# Patient Record
Sex: Male | Born: 1985 | Race: White | Hispanic: No | Marital: Single | Smoking: Never smoker
Health system: Southern US, Community
[De-identification: ages and names within clinical notes are randomized; demographics above are authoritative.]

## PROBLEM LIST (undated history)

## (undated) DIAGNOSIS — E119 Type 2 diabetes mellitus without complications: Secondary | ICD-10-CM

## (undated) HISTORY — PX: ESOPHAGUS SURGERY: SHX626

---

## 2004-11-21 ENCOUNTER — Emergency Department: Payer: Self-pay | Admitting: Emergency Medicine

## 2004-12-31 ENCOUNTER — Emergency Department: Payer: Self-pay | Admitting: Emergency Medicine

## 2005-03-10 ENCOUNTER — Emergency Department: Payer: Self-pay | Admitting: Emergency Medicine

## 2005-03-11 ENCOUNTER — Emergency Department: Payer: Self-pay | Admitting: Emergency Medicine

## 2005-09-18 ENCOUNTER — Inpatient Hospital Stay: Payer: Self-pay | Admitting: Internal Medicine

## 2005-10-03 ENCOUNTER — Emergency Department: Payer: Self-pay | Admitting: Emergency Medicine

## 2005-10-21 ENCOUNTER — Emergency Department: Payer: Self-pay | Admitting: Emergency Medicine

## 2006-03-06 ENCOUNTER — Emergency Department: Payer: Self-pay | Admitting: Internal Medicine

## 2007-02-04 ENCOUNTER — Emergency Department: Payer: Self-pay | Admitting: Emergency Medicine

## 2007-04-24 ENCOUNTER — Emergency Department: Payer: Self-pay | Admitting: Emergency Medicine

## 2007-10-13 ENCOUNTER — Inpatient Hospital Stay: Payer: Self-pay | Admitting: Internal Medicine

## 2007-10-13 ENCOUNTER — Other Ambulatory Visit: Payer: Self-pay

## 2007-10-28 ENCOUNTER — Emergency Department: Payer: Self-pay | Admitting: Emergency Medicine

## 2008-01-02 ENCOUNTER — Emergency Department: Payer: Self-pay | Admitting: Emergency Medicine

## 2008-01-02 ENCOUNTER — Other Ambulatory Visit: Payer: Self-pay

## 2008-01-04 ENCOUNTER — Other Ambulatory Visit: Payer: Self-pay

## 2008-01-04 ENCOUNTER — Emergency Department: Payer: Self-pay | Admitting: Emergency Medicine

## 2008-07-19 ENCOUNTER — Emergency Department: Payer: Self-pay | Admitting: Emergency Medicine

## 2009-03-16 ENCOUNTER — Emergency Department: Payer: Self-pay | Admitting: Emergency Medicine

## 2009-08-20 ENCOUNTER — Emergency Department: Payer: Self-pay | Admitting: Emergency Medicine

## 2009-10-29 ENCOUNTER — Emergency Department: Payer: Self-pay | Admitting: Emergency Medicine

## 2010-03-16 ENCOUNTER — Emergency Department: Payer: Self-pay | Admitting: Emergency Medicine

## 2010-04-05 ENCOUNTER — Emergency Department: Payer: Self-pay | Admitting: Emergency Medicine

## 2010-04-30 ENCOUNTER — Emergency Department: Payer: Self-pay | Admitting: Emergency Medicine

## 2010-08-10 ENCOUNTER — Emergency Department: Payer: Self-pay | Admitting: Emergency Medicine

## 2010-08-19 IMAGING — US US EXTREM UP VENOUS*L*
1 series · 17 of 24 positions shown · non-contrast
Comparison: none

REASON FOR EXAM: eval for blood clot
COMMENTS:

[Series 1: us extrem up venous*left* · 17 of 33 slices shown]
[im 1/33]
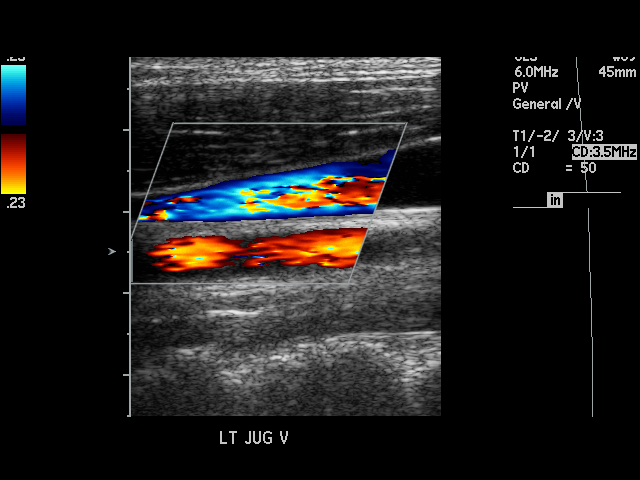
[im 3/33]
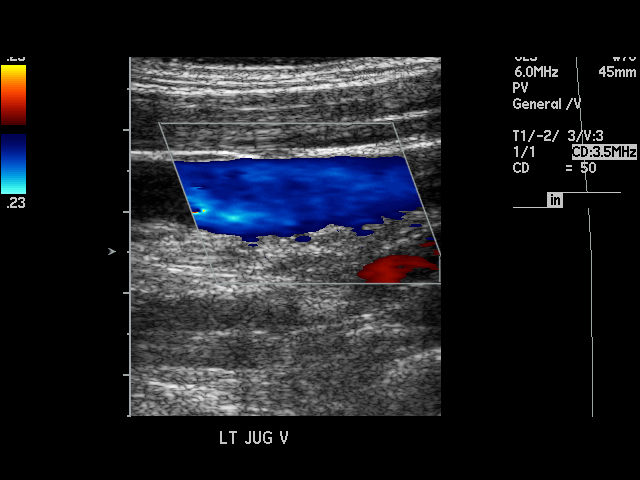
[im 5/33]
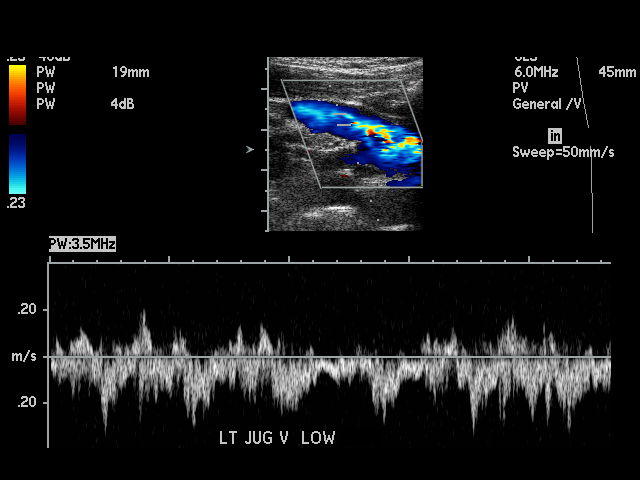
[im 6/33]
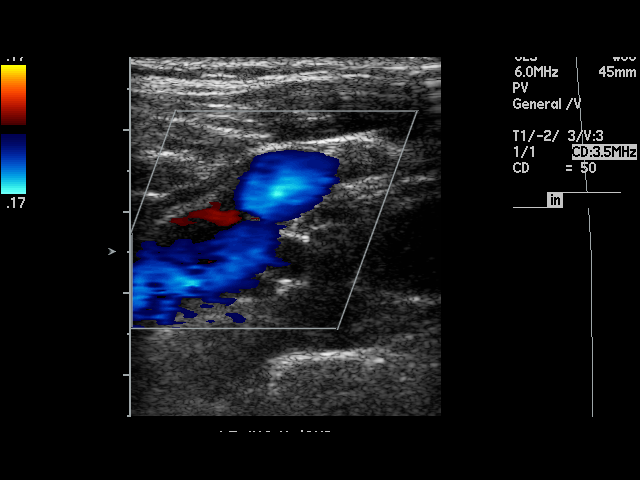
[im 9/33]
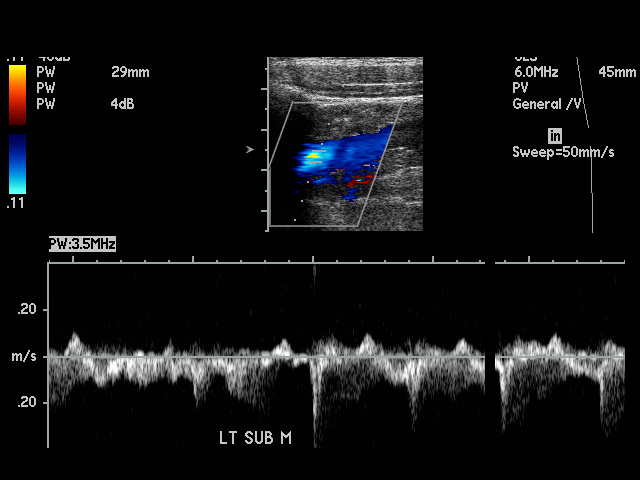
[im 10/33]
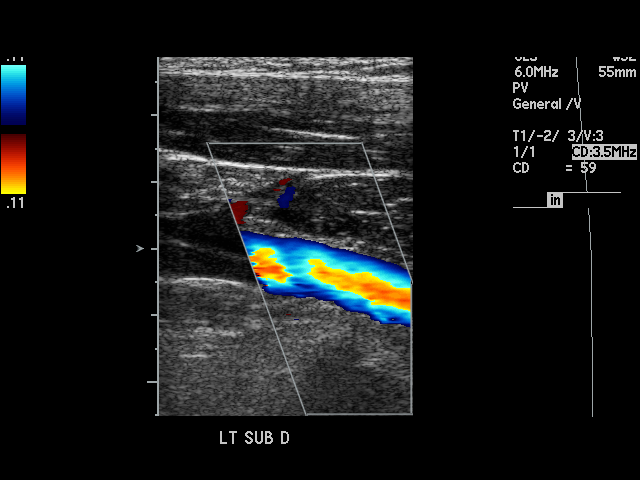
[im 13/33]
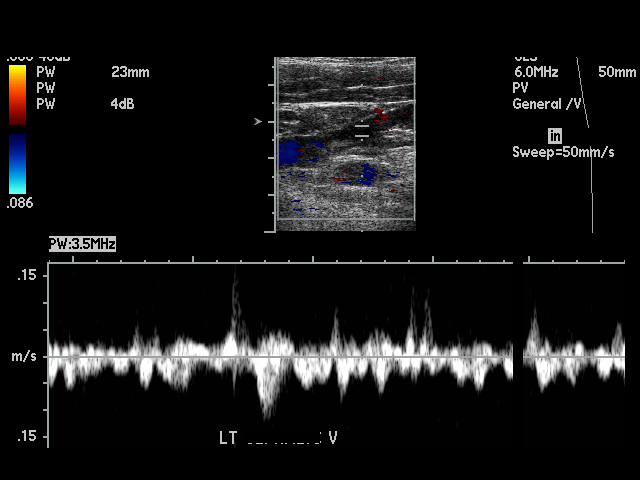
[im 14/33]
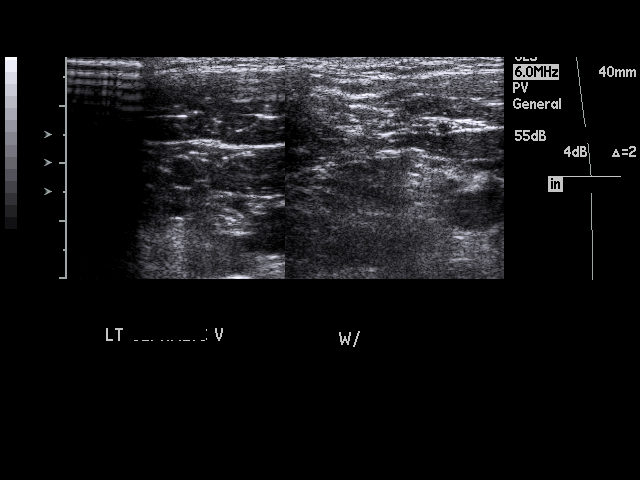
[im 17/33]
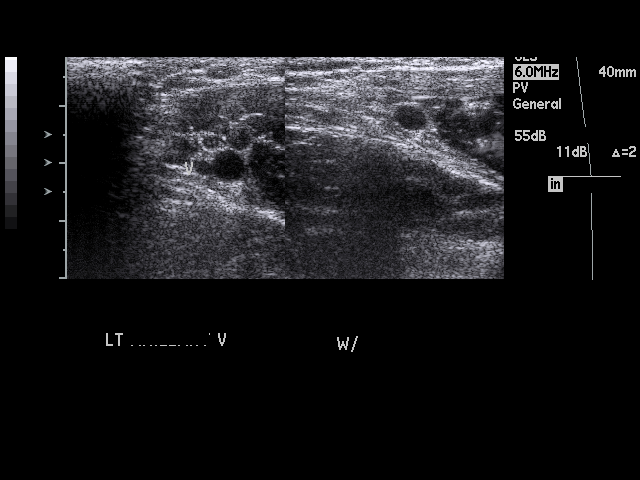
[im 19/33]
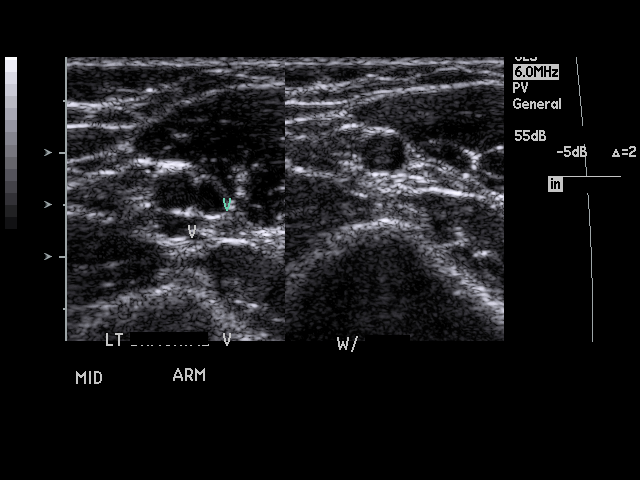
[im 20/33]
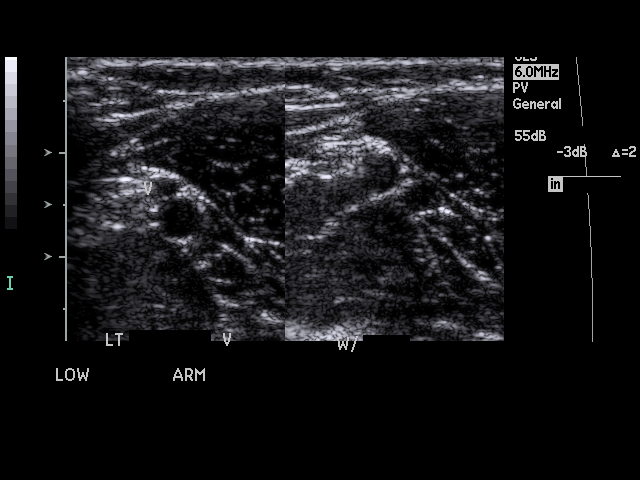
[im 23/33]
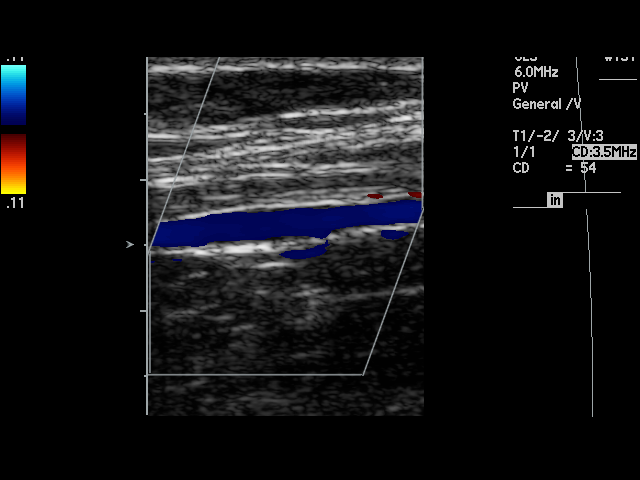
[im 24/33]
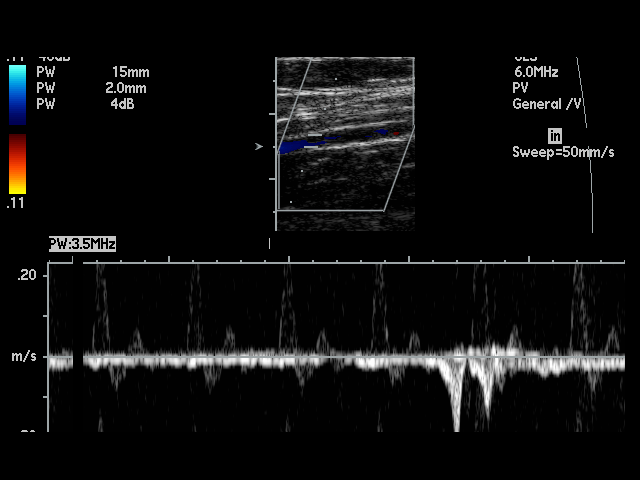
[im 27/33]
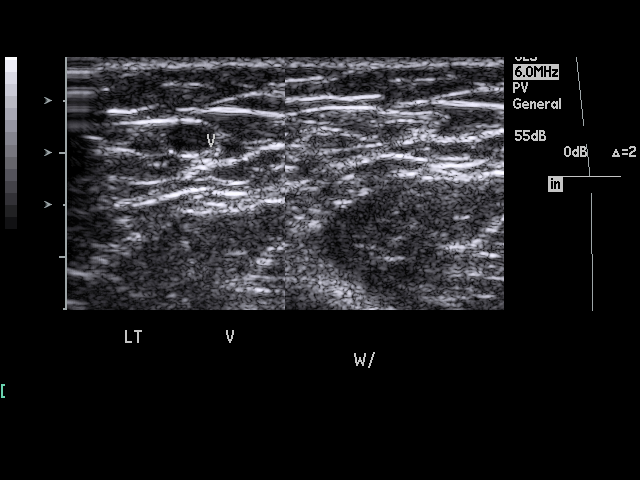
[im 28/33]
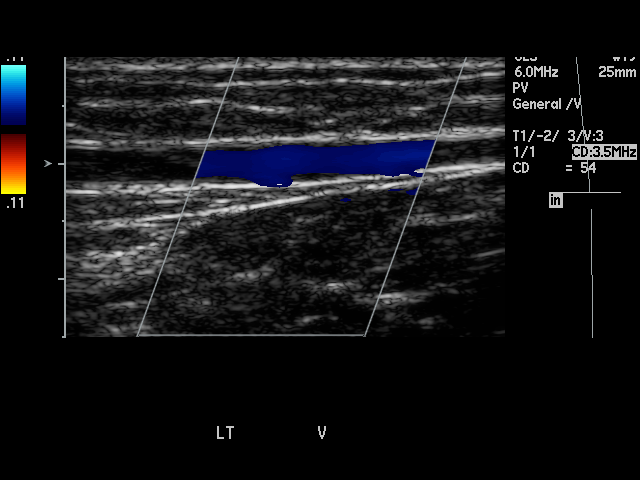
[im 30/33]
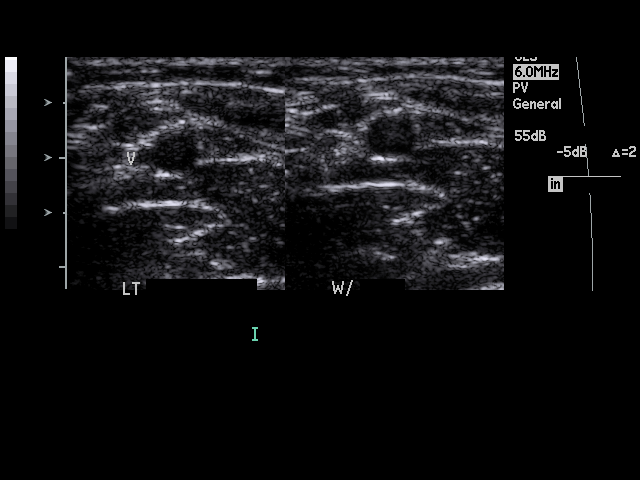
[im 33/33]
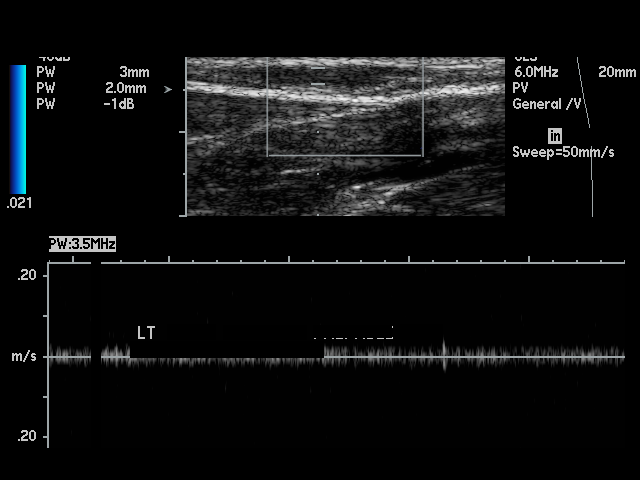

[17 of 24 positions shown; findings below may reference images not displayed]

PROCEDURE:     US  - US DOPPLER UP EXTR LEFT  - May 01, 2010  [DATE]

RESULT:     Duplex Doppler interrogation of the deep venous system of the
left upper extremity is performed from the jugular vein into the antecubital
region. The study demonstrates a noncompressible segment over a short area
in a superficial vein which may represent a branch extending to the basilic
vein in the left upper forearm. Technically, the sonographer could not
connect this venous structure to the basilic vein. The basilic is otherwise
patent. The cephalic vein is also patent. More proximally the deep venous
structures are patent and show normal compressibility with normal color and
SPECTRAL Doppler appearance.
IMPRESSION: Superficial thrombus as described. No evidence of upper
extremity DVT. Serial follow-up exams may be beneficial to document
resolution.

## 2011-02-21 ENCOUNTER — Inpatient Hospital Stay: Payer: Self-pay | Admitting: Internal Medicine

## 2011-08-10 ENCOUNTER — Emergency Department: Payer: Self-pay | Admitting: Emergency Medicine

## 2011-08-24 ENCOUNTER — Inpatient Hospital Stay: Payer: Self-pay | Admitting: Internal Medicine

## 2011-11-25 ENCOUNTER — Emergency Department: Payer: Self-pay | Admitting: *Deleted

## 2011-11-25 LAB — CBC WITH DIFFERENTIAL/PLATELET
Basophil #: 0 10*3/uL (ref 0.0–0.1)
Basophil %: 0.3 %
Eosinophil %: 4.8 %
Lymphocyte #: 3.8 10*3/uL — ABNORMAL HIGH (ref 1.0–3.6)
Lymphocyte %: 34.8 %
MCH: 30.3 pg (ref 26.0–34.0)
MCHC: 33.9 g/dL (ref 32.0–36.0)
MCV: 89 fL (ref 80–100)
Monocyte #: 0.7 10*3/uL (ref 0.0–0.7)
Monocyte %: 6.1 %
Neutrophil #: 5.9 10*3/uL (ref 1.4–6.5)
Neutrophil %: 54 %
Platelet: 246 10*3/uL (ref 150–440)
RDW: 12.8 % (ref 11.5–14.5)

## 2011-11-25 LAB — COMPREHENSIVE METABOLIC PANEL
Anion Gap: 20 — ABNORMAL HIGH (ref 7–16)
BUN: 15 mg/dL (ref 7–18)
Bilirubin,Total: 0.5 mg/dL (ref 0.2–1.0)
Chloride: 99 mmol/L (ref 98–107)
Creatinine: 1.01 mg/dL (ref 0.60–1.30)
EGFR (African American): >60
Potassium: 3.5 mmol/L (ref 3.5–5.1)
Total Protein: 8 g/dL (ref 6.4–8.2)

## 2011-11-25 LAB — ETHANOL
Ethanol %: 0.24 % — ABNORMAL HIGH (ref 0.000–0.080)
Ethanol: 240 mg/dL

## 2012-05-16 ENCOUNTER — Emergency Department: Payer: Self-pay | Admitting: Emergency Medicine

## 2012-09-19 ENCOUNTER — Inpatient Hospital Stay: Payer: Self-pay | Admitting: Internal Medicine

## 2012-09-19 LAB — BASIC METABOLIC PANEL
BUN: 24 mg/dL — ABNORMAL HIGH (ref 7–18)
Co2: 22 mmol/L (ref 21–32)
EGFR (African American): >60
Glucose: 135 mg/dL — ABNORMAL HIGH (ref 65–99)
Potassium: 3.8 mmol/L (ref 3.5–5.1)
Sodium: 138 mmol/L (ref 136–145)

## 2012-09-19 LAB — COMPREHENSIVE METABOLIC PANEL
Albumin: 3.1 g/dL — ABNORMAL LOW (ref 3.4–5.0)
Alkaline Phosphatase: 125 U/L (ref 50–136)
Alkaline Phosphatase: 103 U/L (ref 50–136)
Anion Gap: 17 — ABNORMAL HIGH (ref 7–16)
BUN: 30 mg/dL — ABNORMAL HIGH (ref 7–18)
Bilirubin,Total: 0.7 mg/dL (ref 0.2–1.0)
Calcium, Total: 8.4 mg/dL — ABNORMAL LOW (ref 8.5–10.1)
Co2: 18 mmol/L — ABNORMAL LOW (ref 21–32)
Creatinine: 1.29 mg/dL (ref 0.60–1.30)
EGFR (African American): 59 — ABNORMAL LOW
EGFR (Non-African Amer.): >60
Glucose: 446 mg/dL — ABNORMAL HIGH (ref 65–99)
Glucose: 308 mg/dL — ABNORMAL HIGH (ref 65–99)
Osmolality: 292 (ref 275–301)
Potassium: 4.6 mmol/L (ref 3.5–5.1)
Potassium: 4.4 mmol/L (ref 3.5–5.1)
SGOT(AST): 19 U/L (ref 15–37)
SGPT (ALT): 29 U/L (ref 12–78)
Sodium: 132 mmol/L — ABNORMAL LOW (ref 136–145)
Sodium: 137 mmol/L (ref 136–145)
Total Protein: 7.3 g/dL (ref 6.4–8.2)
Total Protein: 5.9 g/dL — ABNORMAL LOW (ref 6.4–8.2)

## 2012-09-19 LAB — URINALYSIS, COMPLETE
Bilirubin,UR: NEGATIVE
Glucose,UR: >=500 mg/dL (ref 0–75)
Hyaline Cast: 25
Nitrite: NEGATIVE
WBC UR: 1 /HPF (ref 0–5)

## 2012-09-19 LAB — CBC
HCT: 48.1 % (ref 40.0–52.0)
HGB: 15.5 g/dL (ref 13.0–18.0)
Platelet: 338 10*3/uL (ref 150–440)
RBC: 5.31 10*6/uL (ref 4.40–5.90)

## 2012-09-19 LAB — MAGNESIUM: Magnesium: 1.9 mg/dL

## 2012-09-20 LAB — CBC WITH DIFFERENTIAL/PLATELET
Basophil %: 0.3 %
HCT: 34.5 % — ABNORMAL LOW (ref 40.0–52.0)
MCH: 30.3 pg (ref 26.0–34.0)
MCHC: 33.4 g/dL (ref 32.0–36.0)
MCV: 91 fL (ref 80–100)
Monocyte %: 7.6 %
Neutrophil #: 6.8 10*3/uL — ABNORMAL HIGH (ref 1.4–6.5)
RDW: 13.7 % (ref 11.5–14.5)
WBC: 10.8 10*3/uL — ABNORMAL HIGH (ref 3.8–10.6)

## 2012-09-20 LAB — BASIC METABOLIC PANEL
Calcium, Total: 7.7 mg/dL — ABNORMAL LOW (ref 8.5–10.1)
Chloride: 112 mmol/L — ABNORMAL HIGH (ref 98–107)
Co2: 24 mmol/L (ref 21–32)
EGFR (Non-African Amer.): >60
Glucose: 117 mg/dL — ABNORMAL HIGH (ref 65–99)
Potassium: 3.5 mmol/L (ref 3.5–5.1)
Sodium: 144 mmol/L (ref 136–145)

## 2012-11-24 ENCOUNTER — Emergency Department: Payer: Self-pay | Admitting: Emergency Medicine

## 2012-11-24 LAB — COMPREHENSIVE METABOLIC PANEL
Albumin: 4 g/dL (ref 3.4–5.0)
Alkaline Phosphatase: 88 U/L (ref 50–136)
Anion Gap: 8 (ref 7–16)
Bilirubin,Total: 1.1 mg/dL — ABNORMAL HIGH (ref 0.2–1.0)
Chloride: 105 mmol/L (ref 98–107)
Co2: 20 mmol/L — ABNORMAL LOW (ref 21–32)
Creatinine: 1.09 mg/dL (ref 0.60–1.30)
EGFR (African American): >60
Potassium: 4.7 mmol/L (ref 3.5–5.1)
SGPT (ALT): 22 U/L (ref 12–78)
Total Protein: 7.4 g/dL (ref 6.4–8.2)

## 2012-11-24 LAB — CBC WITH DIFFERENTIAL/PLATELET
Basophil #: 0 10*3/uL (ref 0.0–0.1)
Basophil %: 0.3 %
Eosinophil #: 0.6 10*3/uL (ref 0.0–0.7)
HCT: 49.1 % (ref 40.0–52.0)
HGB: 16.4 g/dL (ref 13.0–18.0)
Lymphocyte #: 1.1 10*3/uL (ref 1.0–3.6)
MCHC: 33.4 g/dL (ref 32.0–36.0)
MCV: 89 fL (ref 80–100)
Monocyte #: 0.5 x10 3/mm (ref 0.2–1.0)
Monocyte %: 3.3 %
Platelet: 259 10*3/uL (ref 150–440)
RBC: 5.53 10*6/uL (ref 4.40–5.90)
RDW: 12.4 % (ref 11.5–14.5)
WBC: 13.8 10*3/uL — ABNORMAL HIGH (ref 3.8–10.6)

## 2012-11-24 LAB — URINALYSIS, COMPLETE
Bilirubin,UR: NEGATIVE
Blood: NEGATIVE
Glucose,UR: >=500 mg/dL (ref 0–75)
Nitrite: NEGATIVE
Ph: 5 (ref 4.5–8.0)
Protein: NEGATIVE
Specific Gravity: 1.032 (ref 1.003–1.030)
Squamous Epithelial: NONE SEEN

## 2012-11-24 LAB — LIPASE, BLOOD: Lipase: 53 U/L — ABNORMAL LOW (ref 73–393)

## 2013-02-15 ENCOUNTER — Inpatient Hospital Stay: Payer: Self-pay | Admitting: Specialist

## 2013-02-15 LAB — COMPREHENSIVE METABOLIC PANEL
Albumin: 4.4 g/dL (ref 3.4–5.0)
Alkaline Phosphatase: 152 U/L — ABNORMAL HIGH (ref 50–136)
Anion Gap: 16 (ref 7–16)
BUN: 21 mg/dL — ABNORMAL HIGH (ref 7–18)
Bilirubin,Total: 1.1 mg/dL — ABNORMAL HIGH (ref 0.2–1.0)
Calcium, Total: 9.5 mg/dL (ref 8.5–10.1)
Chloride: 99 mmol/L (ref 98–107)
Co2: 16 mmol/L — ABNORMAL LOW (ref 21–32)
Creatinine: 1.12 mg/dL (ref 0.60–1.30)
EGFR (African American): >60
EGFR (Non-African Amer.): >60
Glucose: 577 mg/dL (ref 65–99)
Potassium: 5.9 mmol/L — ABNORMAL HIGH (ref 3.5–5.1)
SGPT (ALT): 21 U/L (ref 12–78)
Total Protein: 8.3 g/dL — ABNORMAL HIGH (ref 6.4–8.2)

## 2013-02-15 LAB — URINALYSIS, COMPLETE
Bilirubin,UR: NEGATIVE
Blood: NEGATIVE
Glucose,UR: >=500 mg/dL (ref 0–75)
Protein: NEGATIVE
Specific Gravity: 1.028 (ref 1.003–1.030)
Squamous Epithelial: NONE SEEN
WBC UR: <1 /HPF (ref 0–5)

## 2013-02-15 LAB — CBC
HCT: 49.5 % (ref 40.0–52.0)
HGB: 16.6 g/dL (ref 13.0–18.0)
MCH: 29.9 pg (ref 26.0–34.0)
MCHC: 33.5 g/dL (ref 32.0–36.0)
RDW: 13.6 % (ref 11.5–14.5)

## 2013-02-15 LAB — BASIC METABOLIC PANEL
Anion Gap: 14 (ref 7–16)
BUN: 22 mg/dL — ABNORMAL HIGH (ref 7–18)
Calcium, Total: 8.5 mg/dL (ref 8.5–10.1)
Calcium, Total: 9.1 mg/dL (ref 8.5–10.1)
Chloride: 109 mmol/L — ABNORMAL HIGH (ref 98–107)
Chloride: 116 mmol/L — ABNORMAL HIGH (ref 98–107)
Creatinine: 1.21 mg/dL (ref 0.60–1.30)
Creatinine: 1.16 mg/dL (ref 0.60–1.30)
EGFR (African American): >60
EGFR (Non-African Amer.): >60
Glucose: 159 mg/dL — ABNORMAL HIGH (ref 65–99)
Osmolality: 294 (ref 275–301)
Sodium: 145 mmol/L (ref 136–145)

## 2013-02-15 LAB — LIPASE, BLOOD: Lipase: 40 U/L — ABNORMAL LOW (ref 73–393)

## 2013-02-16 LAB — BASIC METABOLIC PANEL
BUN: 15 mg/dL (ref 7–18)
Chloride: 112 mmol/L — ABNORMAL HIGH (ref 98–107)
Co2: 25 mmol/L (ref 21–32)
Creatinine: 1.08 mg/dL (ref 0.60–1.30)
EGFR (Non-African Amer.): >60
Potassium: 3.9 mmol/L (ref 3.5–5.1)
Sodium: 143 mmol/L (ref 136–145)

## 2013-02-16 LAB — CBC WITH DIFFERENTIAL/PLATELET
Basophil #: 0 10*3/uL (ref 0.0–0.1)
HCT: 37.9 % — ABNORMAL LOW (ref 40.0–52.0)
Monocyte #: 1.2 x10 3/mm — ABNORMAL HIGH (ref 0.2–1.0)
Neutrophil #: 12.8 10*3/uL — ABNORMAL HIGH (ref 1.4–6.5)
Neutrophil %: 73.4 %
RBC: 4.39 10*6/uL — ABNORMAL LOW (ref 4.40–5.90)
RDW: 13.6 % (ref 11.5–14.5)

## 2013-02-16 LAB — HEMOGLOBIN A1C: Hemoglobin A1C: 10.8 % — ABNORMAL HIGH (ref 4.2–6.3)

## 2013-02-17 ENCOUNTER — Emergency Department: Payer: Self-pay | Admitting: Emergency Medicine

## 2013-02-17 LAB — BASIC METABOLIC PANEL
Anion Gap: 6 — ABNORMAL LOW (ref 7–16)
BUN: 12 mg/dL (ref 7–18)
Calcium, Total: 9 mg/dL (ref 8.5–10.1)
Chloride: 102 mmol/L (ref 98–107)
Co2: 31 mmol/L (ref 21–32)
Creatinine: 0.98 mg/dL (ref 0.60–1.30)
EGFR (African American): >60
Glucose: 402 mg/dL — ABNORMAL HIGH (ref 65–99)
Potassium: 4.3 mmol/L (ref 3.5–5.1)
Sodium: 139 mmol/L (ref 136–145)

## 2013-07-11 ENCOUNTER — Emergency Department: Payer: Self-pay | Admitting: Emergency Medicine

## 2013-07-11 LAB — URINALYSIS, COMPLETE
Blood: NEGATIVE
Leukocyte Esterase: NEGATIVE
RBC,UR: 1 /HPF (ref 0–5)
WBC UR: NONE SEEN /HPF (ref 0–5)

## 2013-07-11 LAB — COMPREHENSIVE METABOLIC PANEL
Alkaline Phosphatase: 194 U/L — ABNORMAL HIGH (ref 50–136)
Anion Gap: 9 (ref 7–16)
BUN: 21 mg/dL — ABNORMAL HIGH (ref 7–18)
Bilirubin,Total: 0.6 mg/dL (ref 0.2–1.0)
Calcium, Total: 9.5 mg/dL (ref 8.5–10.1)
Chloride: 93 mmol/L — ABNORMAL LOW (ref 98–107)
Co2: 25 mmol/L (ref 21–32)
EGFR (African American): >60
EGFR (Non-African Amer.): >60
Glucose: 581 mg/dL (ref 65–99)
Osmolality: 285 (ref 275–301)
Potassium: 4.4 mmol/L (ref 3.5–5.1)
SGPT (ALT): 23 U/L (ref 12–78)
Sodium: 127 mmol/L — ABNORMAL LOW (ref 136–145)
Total Protein: 8.2 g/dL (ref 6.4–8.2)

## 2013-07-11 LAB — CBC
HCT: 46.1 % (ref 40.0–52.0)
MCH: 30.7 pg (ref 26.0–34.0)
MCHC: 34.5 g/dL (ref 32.0–36.0)
WBC: 9.4 10*3/uL (ref 3.8–10.6)

## 2014-01-27 ENCOUNTER — Emergency Department: Payer: Self-pay | Admitting: Emergency Medicine

## 2014-01-28 LAB — BETA STREP CULTURE(ARMC)

## 2014-10-22 ENCOUNTER — Ambulatory Visit: Payer: Self-pay

## 2015-01-11 NOTE — H&P (Signed)
PATIENT NAME:  Jeff Gutierrez, Jeff Gutierrez MR#:  [id number] DATE OF BIRTH:  1985-10-21  DATE OF ADMISSION:  09/19/2012  REFERRING PHYSICIAN: Janalyn Harderavid Kaminski, MD  PR[facility]IC[facility], [facility]  CHIEF COMPLAINT: Nausea and vomiting.   HISTORY OF PRESENT ILLNESS: This is a 29 year old male with history of type 1 diabetes mellitus on home NovoLog and Humulin N who presents with complaints of nausea and vomiting. The patient reports he has been out of his glucose monitoring strips for one week. As well, he reports he has been trying to save on his Humulin insulin and has not been taking it for a week. The patient reports he is on Humulin N 50 units at bedtime and as well on NovoLog sliding scale plus 10 units before each meal. Reportedly he has been trying to save on his Humulin N so he has not been taking it for the last week. As well he has been only taking 10 units before meals of NovoLog without sliding scale coverage as he is out of the testing strips. The patient reports for the last two days he has been complaining of nausea and vomiting, which prompted him to come to the ED.  In the ED, the patient was found to be in acute renal failure with a creatinine of 1.7, as well in DKA with pH of 7.29, and with CO2 of 216 and anion gap of 17. The patient received 2 liters of fluid and started on insulin drip. The patient reports he started feeling improvement in his symptoms. The patient denies any shortness of breath, any chest pain, any coffee-ground emesis, any diarrhea or dysuria. Hospitalist services were requested to admit the patient for further management and treatment of his DKA.   PAST MEDICAL HISTORY:  1. Diabetes type 1, insulin-dependent. 2. Hypoglycemic seizures.   HOME MEDICATIONS: 1. Humulin N 50 units at bedtime, but the patient has not been taking it for the last week.  2. NovoLog sliding scale 10 units plus sliding scale before meals. The patient only has been  taking 10 units before meals without insulin sliding scale coverage for the last few days.   ALLERGIES: No known drug allergies.   SOCIAL HISTORY: No smoking. No alcohol. No IV drug use. Working in [employer].   FAMILY HISTORY: Significant for diabetes in his grandparents.   REVIEW OF SYSTEMS:   CONSTITUTIONAL: No fevers. No chills but complains of fatigue and weakness.   EYES: Blurry vision, double vision, inflammation, glaucoma or pain.   ENT: Denies tinnitus, ear pain, hearing loss, epistaxis or discharge.   RESPIRATORY: Denies cough, wheezing, hemoptysis, dyspnea or COPD.  CARDIOVASCULAR: Denies any chest pain, orthopnea, edema, arrhythmia, palpitations or syncope.   GASTROINTESTINAL: Complains of nausea and vomiting. Denies any diarrhea, constipation, hematemesis, melena, jaundice or rectal bleed.   GENITOURINARY: Denies dysuria, hematuria or renal colic.  ENDOCRINE: Denies polyuria, polydipsia or heat or cold intolerance.   HEMATOLOGY: Denies anemia, easy bruising or bleeding diathesis.   INTEGUMENTARY: Denies acne, rash or skin lesion.   MUSCULOSKELETAL: Denies any gout, redness, limited activity, arthritis or cramps.   NEUROLOGICAL: Denies numbness, dysarthria, epilepsy, tremors or vertigo.   PSYCHIATRIC: Denies anxiety, insomnia, schizophrenia, nervousness or bipolar disorder.   PHYSICAL EXAMINATION:  VITAL SIGNS: Temperature 96, pulse 103, respiratory 22, blood pressure 143/79 and saturating 100% on room air.   GENERAL: Well-nourished male who looks comfortable and in no apparent distress.   HEENT: Head atraumatic, normocephalic . Pupils equal  and reactive to light. Pink conjunctivae. Anicteric sclerae. Dry oral mucosa.   NECK: Supple. No thyromegaly. No JVD.   CHEST: Good air entry bilaterally. No wheezing, rales or rhonchi.   CARDIOVASCULAR: S1 and S2 heard. No rubs, murmurs or gallops. Tachycardic.   ABDOMEN: Soft, nontender and  nondistended. Bowel sounds present.   EXTREMITIES: No edema, no clubbing and no cyanosis.   PSYCHIATRIC: Appropriate affect. Awake and alert x 3. Intact judgment and insight.   NEUROLOGIC: Cranial nerves II through XII grossly intact. Motor 5 out of 5.   SKIN: Warm and dry. Delayed skin turgor.   PERTINENT LABS: Glucose 446, BUN 38, creatinine 1.78, sodium 132, potassium 4.6, chloride 99 and CO2 16. Anion gap 17. Osmolality 293. Lipase 36. White blood cells 18.8, hemoglobin 15.5, hematocrit 48.1 and platelet 338.   Urinalysis is showing a glucose of more than 500, negative leukocyte esterase and nitrites.   ABG is showing pH of 7.29.  ASSESSMENT AND PLAN: A 29 year old male with type 1 diabetes who presents with nausea, vomiting and DKA due to noncompliance with medications secondary to financial issues.  1. DKA. The patient has not been taking his Humulin N for one week and taking lower dose NovoLog, complaining of nausea and vomiting, dehydrated. The patient will be admitted to the CCU and will be started on an insulin drip, as per protocol. We will continue with IV fluids. He already received 2 liters of IV normal saline in the ED. We will give him another 2 liters. We will keep him on 150 mL/h. We will monitor his electrolytes closely and replace as needed. We will have him on clear liquid diet and on nausea medication. Once able to tolerate that, we will advance his diet as tolerated. We will consult social worker and case management for possible need for assistance with his insulin at home.  2. Acute renal failure. This is secondary to dehydration from volume depletion, nausea and vomiting and DKA. We will continue with IV normal saline and we will monitor electrolytes and renal function closely.  3. Leukocytosis. This is stress induced from his DKA and nausea and vomiting. He has negative urinalysis, afebrile. We will recheck CBC in a.m.  4. CODE STATUS: The patient is FULL CODE. 5. DVT  with subcutaneous heparin.   TOTAL TIME SPENT ON ADMISSION AND PATIENT CARE: 55 minutes.  ____________________________ Starleen Arms, MD dse:sb Gutierrez: 09/19/2012 03:12:36 ET T: 09/19/2012 07:15:18 ET JOB#: [id number]  cc: Starleen Arms, MD, <Dictator> DAWOOD Teena Irani MD ELECTRONICALLY SIGNED 09/20/2012 0:35

## 2015-01-14 NOTE — Discharge Summary (Signed)
PATIENT NAME:  Jeff NestleLOY, Cash D MR#:  [id number] DATE OF BIRTH:  1985-12-17  DATE OF ADMISSION:  02/15/2013 DATE OF DISCHARGE:  02/16/2013  HISTORY AND PHYSICAL:  For a detailed note, please take a look at the history and physical done on admission.   DISCHARGE DIAGNOSES: As follows:  1.  Acute diabetic ketoacidosis.  2.  Hyponatremia.  3.  Hyperkalemia.  4.  Leukocytosis.   DIET: The patient is being discharged a carb-controlled diet.   ACTIVITY: As tolerated.   FOLLOWUP: With [facility].  DISCHARGE MEDICATIONS:  Humulin NPH 30 units at bedtime and Humalog 5 units t.i.d. with meals.   BRIEF HOSPITAL COURSE: This is a 29 year old male with medical problems as mentioned above, presented to the hospital with persistent nausea, vomiting and noted to have significantly elevated blood sugars and noted to be in acute diabetic ketoacidosis.   1.  Acute diabetic ketoacidosis:  This was likely secondary to the patient's recent acute viral infection that he has been suffering from, complicated with possible underlying noncompliance. The patient's hemoglobin A1c was noted to be as high as 10. The patient was admitted to the intensive care unit, started on aggressive IV fluids and also an insulin drip. Serial metabolic profiles were obtained. The patient was eventually taken off the insulin drip. As his anion gap closed, his nausea and vomiting has since then resolved. He has been tolerating p.o. well on a carb-controlled diet.  He is currently being discharged back on his NPH and Humulin with meals. He was advised to have a close followup with his [facility]in[facility] so his insulin therapy can be further adjusted.   2.  Leukocytosis:  I suspect this was likely secondary to stress-mediated from diabetic ketoacidosis. It did trend down. The patient had no fever, and he remained hemodynamically stable. This can further be followed as an outpatient.   3.  Acute renal failure: This was likely secondary to dehydration and acute diabetic ketoacidosis. This has since then improved and resolved with IV fluid hydration.  4.  Hyperkalemia:  The patient presented with a potassium of 5.9. This was likely secondary to acidosis and from the renal failure. His potassium has now normalized as his acidosis has been corrected from the insulin drip and IV fluids.  5.  Hyponatremia:  This was pseudohyponatremia from severe hyperglycemia.  It has since then corrected itself and has significantly improved as the sugars have improved.   CODE STATUS: The patient is a FULL CODE.   DISPOSITION: He is being discharged home.    TIME SPENT:  40 minutes.   ____________________________ Rolly PancakeVivek J. Cherlynn KaiserSainani, MD vjs:cb D: 02/16/2013 15:12:57 ET T: 02/16/2013 15:37:58 ET JOB#: [id number]  cc: Rolly PancakeVivek J. Cherlynn KaiserSainani, MD, <Dictator> Houston SirenVIVEK J SAINANI MD ELECTRONICALLY SIGNED 03/01/2013 20:28

## 2015-01-14 NOTE — Discharge Summary (Signed)
PATIENT NAME:  Jeff Gutierrez, Jeff Gutierrez MR#:  [id number] DATE OF BIRTH:  Oct 23, 1985  DATE OF ADMISSION:  09/19/2012 DATE OF DISCHARGE:  09/20/2012  FINAL DIAGNOSES: 1.  Diabetic ketoacidosis, resolved.  2.  Acute renal failure, improved.  3.  Leukocytosis.  4.  Hypokalemia, hypomagnesemia.   MEDICATIONS ON DISCHARGE:  Include insulin isophane 100 units/mL, human recombinant subcutaneous suspension 50 units subcutaneous twice a day, insulin lispro 10 units subcutaneous injection 3 times a day prior to meals, potassium chloride 20 mEq daily for 3 days, magnesium oxide 400 mg daily for 5 days.   DIET: Carbohydrate-controlled diet, regular consistency.   ACTIVITY:  As tolerated.   FO[facility]UP[facility]  [facility]   REASON FOR ADMISSION: The patient was admitted 12/27 with nausea, vomiting.   HISTORY OF PRESENT ILLNESS: A 29 year old man with type 1 diabetes who presented with nausea, vomiting. He been out of his glucose monitoring strip for 1 week. He was trying to save on his Humulin insulin and had not been taking it. He was found to be in diabetic ketoacidosis with a pH of 7.29, anion gap of 17. He was admitted and started on insulin drip and IV fluid hydration was given.   LABORATORY AND RADIOLOGICAL DATA DURING THE HOSPITAL COURSE:  Included a lipase of 36, glucose 446, BUN 38, creatinine 1.78, sodium 132, potassium 4.6, chloride 99, CO2 16, calcium 9.4. Liver function tests normal range. White blood cell count 18.8, H and H 15.5 and 48.1, platelet count of 338. Urinalysis 1+ ketones, greater than 500 mg/dL of glucose. Venous pH of 7.29. Chemistry upon discharge included a glucose of 117, BUN 14, creatinine 0.93, sodium 144, potassium 3.5, chloride 112, CO2 24. White blood cell count upon discharge 10.8. Last finger stick upon discharge 127.  HOSPITAL COURSE PER PROBLEM LIST:  1.  DKA which had resolved. The patient was initially started on an insulin drip in the ICU, converted back over  to his usual medications of 50 units subcutaneous twice a day of Humulin-N. He was put back on his short-acting lispro. He does have his medication at home, the Humulin-N, and I did give him a script for the lispro insulin, Flex pen. Noncompliance here was the issue.  2.  Acute renal failure. This had improved with IV fluid hydration.  No more nausea or vomiting. He was tolerating diet.  3.  Leukocytosis, probably from the DKA and nausea and vomiting. This had improved.  4.  Hypokalemia and hypomagnesemia. This was probably from the insulin drip. I will replace for a few days upon discharge.   TIME SPENT ON DISCHARGE: 35 minutes.     ____________________________ Herschell Dimesichard J. Renae GlossWieting, MD rjw:cs Gutierrez: 09/20/2012 14:45:33 ET T: 09/21/2012 18:22:55 ET JOB#: [id number]  cc: Herschell Dimesichard J. Renae GlossWieting, MD, <Dictator> [facility] Jeff Mcfarlane J Tahji Duvall MD ELECTRONICALLY SIGNED 09/25/2012 19:07

## 2015-01-14 NOTE — H&P (Signed)
PATIENT NAME:  Jeff Gutierrez, Jeff Gutierrez MR#:  [id number] DATE OF BIRTH:  Jan 12, 1986  DATE OF ADMISSION:  02/15/2013  PRIMARY CARE PHYSICIAN: [facility] at [facility].   CHIEF COMPLAINT: Nausea, vomiting and elevated blood sugars.   HISTORY OF PRESENT ILLNESS: This is a 29 year old male who presents to the hospital with significantly elevated blood sugars and having persistent nausea, vomiting since late last Sunday evening. The patient presented to the ER and was noted to be severely hyperglycemic and noted to be in acute diabetic ketoacidosis.   Hospitalist services were contacted for further treatment and evaluation. The patient does say that he had an upper respiratory infection about 2 weeks ago, and he is still dealing with the cycle of the upper respiratory infection. He still has a cough but no fevers, no sore throat. No other associated symptoms presently.   REVIEW OF SYSTEMS:  CONSTITUTIONAL: No documented fever. No weight gain. No weight loss.  EYES: No blurry or doubled vision.  ENT: No tinnitus. No postnasal drip. No redness of the oropharynx.  RESPIRATORY: No cough, no wheeze, no hemoptysis, no dyspnea.  CARDIOVASCULAR: No chest pain, no orthopnea, no palpitations, no syncope.  GASTROINTESTINAL: Positive nausea. Positive vomiting. No diarrhea. No abdominal pain, no melena or hematochezia.  GENITOURINARY: No dysuria or hematuria.  ENDOCRINE: No polyuria or nocturia. No heat or cold intolerance.  HEMATOLOGIC: No anemia, bruising, and bleeding.  INTEGUMENTARY: No rashes. No lesions.  MUSCULOSKELETAL: No arthritis. No swelling. No gout.  NEUROLOGIC: No numbness, tingling. No ataxia. No seizure-type activity.  PSYCHIATRIC: No anxiety, no insomnia, no ADD.   PAST MEDICAL HISTORY: Consistent with type 1 diabetes.   ALLERGIES: No known drug allergies.   SOCIAL HISTORY: No smoking. No alcohol abuse. Lives at home with his wife and his son.   FAMILY HISTORY: Mother is alive and healthy.  No medical problems. Father does have some heart problems.   CURRENT MEDICATIONS: The patient is on NPH insulin, 30 units at bedtime, and NovoLog 5 units with meals, along with a sliding-scale.   PHYSICAL EXAMINATION ON ADMISSION: Is as follows: VITAL SIGNS: Are noted to be: Temperature is 97.8, pulse 111, respirations 20, blood pressure 122/67, sats 98% on room air.  GENERAL: He is a pleasant-appearing male in mild distress.  HEENT: Atraumatic, normocephalic. Extraocular muscles are intact. Pupils are equal and reactive to light. Sclerae anicteric. No conjunctival injection. No pharyngeal erythema.  NECK: Supple. There is no jugular venous distention, no bruits, no lymphadenopathy, no thyromegaly.  HEART: Regular rate and rhythm, tachycardic. No murmurs, no rubs, no clicks.  LUNGS: Clear to auscultation bilaterally. No rales, no rhonchi. No wheezes.  ABDOMEN: Soft, flat, nontender, nondistended. Has good bowel sounds. No hepatosplenomegaly appreciated.  EXTREMITIES: No evidence of any cyanosis or peripheral edema. Has +2 pedal and radial pulses bilaterally.  NEUROLOGICAL: He is alert, awake, and oriented x 3, with no focal motor or sensory deficits appreciated bilaterally.  SKIN: Moist and warm, with no rashes appreciated.  LYMPHATIC: There is no cervical or axillary adenopathy.   LABORATORY EXAM: Showed a serum glucose of 577, BUN 21, creatinine 1.12, sodium 131, potassium 5.9, chloride 99, bicarbonate 16. Albumin is 4.4.   LFTs within normal limits.   White cell count 20.3, hemoglobin 16.6, hematocrit 49.5, platelet count 269.   ASSESSMENT AND PLAN: This is a 29 year old male with a history of type 1 diabetes who presents to the hospital with nausea, vomiting and noted to be in acute diabetic ketoacidosis.  1.  Acute diabetic ketoacidosis: This is likely the cause of the patient's nausea, vomiting and uncontrolled blood sugars. This is probably a result of his recent upper respiratory  tract infection. Will go ahead and start the patient on an insulin drip, DKA protocol. Also give IV fluids, follow serial METBs. He is currently hypokalemic, although his if his K drops below 4.5 will add potassium to his fluids. Will check a hemoglobin A1c and follow him clinically.  2.  Leukocytosis: This is likely stress-mediated from DKA. No evidence of any acute infectious source, so will follow white cell count after his DKA improves.  3.  Acute renal failure: This is likely secondary to dehydration and diabetic ketoacidosis. I will continue IV fluids for now, follow BUN and creatinine and urine output, renally-dose meds, avoid nephrotoxins.  4.  Hyperkalemia: This was likely secondary to acidosis and renal failure. It should improve as the acidosis corrects. I will follow his potassium.  5. Hyponatremia: This is likely pseudo-hyponatremia from the elevated blood sugars. Should correct when his sugars improve.   THE PATIENT IS A FULL CODE.   Critical care time spent is 45 minutes.    ____________________________ Rolly Pancake. Cherlynn Kaiser, MD vjs:dm Gutierrez: 02/15/2013 12:50:07 ET T: 02/15/2013 13:40:33 ET JOB#: [id number]  cc: Rolly Pancake. Cherlynn Kaiser, MD, <Dictator> [place] Siren MD ELECTRONICALLY SIGNED 03/01/2013 20:27

## 2015-05-03 ENCOUNTER — Encounter: Payer: Self-pay | Admitting: Emergency Medicine

## 2015-05-03 ENCOUNTER — Other Ambulatory Visit: Payer: Self-pay

## 2015-05-03 ENCOUNTER — Emergency Department
Admission: EM | Admit: 2015-05-03 | Discharge: 2015-05-04 | Disposition: A | Discharge status: Still In Hospital | Payer: Self-pay | Attending: Emergency Medicine | Admitting: Emergency Medicine

## 2015-05-03 DIAGNOSIS — R112 Nausea with vomiting, unspecified: Secondary | ICD-10-CM

## 2015-05-03 DIAGNOSIS — R1084 Generalized abdominal pain: Secondary | ICD-10-CM

## 2015-05-03 DIAGNOSIS — E1110 Type 2 diabetes mellitus with ketoacidosis without coma: Secondary | ICD-10-CM | POA: Diagnosis present

## 2015-05-03 DIAGNOSIS — E1010 Type 1 diabetes mellitus with ketoacidosis without coma: Secondary | ICD-10-CM | POA: Insufficient documentation

## 2015-05-03 DIAGNOSIS — Z794 Long term (current) use of insulin: Secondary | ICD-10-CM | POA: Insufficient documentation

## 2015-05-03 DIAGNOSIS — Z91013 Allergy to seafood: Secondary | ICD-10-CM | POA: Insufficient documentation

## 2015-05-03 DIAGNOSIS — E1165 Type 2 diabetes mellitus with hyperglycemia: Secondary | ICD-10-CM | POA: Insufficient documentation

## 2015-05-03 HISTORY — DX: Type 2 diabetes mellitus without complications: E11.9

## 2015-05-03 LAB — URINALYSIS COMPLETE WITH MICROSCOPIC (ARMC ONLY)
BILIRUBIN URINE: NEGATIVE
Bacteria, UA: NONE SEEN
HGB URINE DIPSTICK: NEGATIVE
Leukocytes, UA: NEGATIVE
NITRITE: NEGATIVE
Protein, ur: NEGATIVE mg/dL
RBC / HPF: NONE SEEN RBC/hpf (ref 0–5)
Specific Gravity, Urine: 1.026 (ref 1.005–1.030)
Squamous Epithelial / LPF: NONE SEEN
pH: 5 (ref 5.0–8.0)

## 2015-05-03 LAB — BASIC METABOLIC PANEL
ANION GAP: 22 — AB (ref 5–15)
BUN: 36 mg/dL — ABNORMAL HIGH (ref 6–20)
CALCIUM: 9.7 mg/dL (ref 8.9–10.3)
CO2: 17 mmol/L — AB (ref 22–32)
Chloride: 91 mmol/L — ABNORMAL LOW (ref 101–111)
Creatinine, Ser: 1.69 mg/dL — ABNORMAL HIGH (ref 0.61–1.24)
GFR calc Af Amer: >60 mL/min (ref >60)
GFR calc non Af Amer: 53 mL/min — ABNORMAL LOW (ref >60)
Glucose, Bld: 534 mg/dL (ref 65–99)
Potassium: 5 mmol/L (ref 3.5–5.1)
SODIUM: 130 mmol/L — AB (ref 135–145)

## 2015-05-03 LAB — CBC
HEMATOCRIT: 48.9 % (ref 40.0–52.0)
Hemoglobin: 16.4 g/dL (ref 13.0–18.0)
MCH: 29 pg (ref 26.0–34.0)
MCHC: 33.5 g/dL (ref 32.0–36.0)
MCV: 86.4 fL (ref 80.0–100.0)
Platelets: 280 10*3/uL (ref 150–440)
RBC: 5.65 MIL/uL (ref 4.40–5.90)
RDW: 13 % (ref 11.5–14.5)
WBC: 12.4 10*3/uL — AB (ref 3.8–10.6)

## 2015-05-03 LAB — GLUCOSE, CAPILLARY
GLUCOSE-CAPILLARY: 538 mg/dL — AB (ref 65–99)
Glucose-Capillary: 547 mg/dL — ABNORMAL HIGH (ref 65–99)

## 2015-05-03 MED ORDER — PROMETHAZINE HCL 25 MG/ML IJ SOLN
12.5000 mg | Freq: Once | INTRAMUSCULAR | Status: AC
  Administered 2015-05-03: 12.5 mg via INTRAVENOUS
  Filled 2015-05-03: qty 1

## 2015-05-03 MED ORDER — ONDANSETRON 4 MG PO TBDP
ORAL_TABLET | ORAL | Status: AC
  Administered 2015-05-03: 4 mg via ORAL
  Filled 2015-05-03: qty 1

## 2015-05-03 MED ORDER — ONDANSETRON 4 MG PO TBDP
4.0000 mg | ORAL_TABLET | Freq: Once | ORAL | Status: AC
  Administered 2015-05-03: 4 mg via ORAL

## 2015-05-03 MED ORDER — DIAZEPAM 5 MG/ML IJ SOLN
2.0000 mg | Freq: Once | INTRAMUSCULAR | Status: AC
  Administered 2015-05-03: 2 mg via INTRAVENOUS
  Filled 2015-05-03: qty 2

## 2015-05-03 MED ORDER — ONDANSETRON HCL 4 MG/2ML IJ SOLN
4.0000 mg | Freq: Once | INTRAMUSCULAR | Status: AC
  Administered 2015-05-03: 4 mg via INTRAVENOUS

## 2015-05-03 MED ORDER — ONDANSETRON HCL 4 MG/2ML IJ SOLN
INTRAMUSCULAR | Status: AC
  Administered 2015-05-03: 4 mg via INTRAVENOUS
  Filled 2015-05-03: qty 2

## 2015-05-03 MED ORDER — SODIUM CHLORIDE 0.9 % IV BOLUS (SEPSIS)
1000.0000 mL | Freq: Once | INTRAVENOUS | Status: AC
  Administered 2015-05-03: 1000 mL via INTRAVENOUS

## 2015-05-03 NOTE — ED Notes (Signed)
Patient presents to the ED with c/o HYPERglycemia and vomiting. Patient has been without Novolog 70/30 since yesterday; takes 35 units BID. Patient pale and diaphoretic in triage. (+) acetone odor appreciated when patient vomiting.

## 2015-05-03 NOTE — ED Provider Notes (Signed)
[facility] Emergency Department Provider Note  ____________________________________________  Time seen: Approximately 11:21 PM  I have reviewed the triage vital signs and the nursing notes.   HISTORY  Chief Complaint Hyperglycemia and Vomiting    HPI Jeff Gutierrez is a 29 y.o. male who presents to the ED from home with complaints of hyperglycemia and vomiting. Patient is a type I diabetic who states he ran out of his insulin last night. 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 Patient went home in the afternoon and began to experience muscle cramping with intractable vomiting. Currently complains of diffuse muscle cramps and epigastric abdominal pain. Patient continues to dry heave despite IV and ODT Zofran administered prior to his arrival in the room. Denies fever, chills, chest pain, cough, shortness of breath, diarrhea, dysuria, headache. Does complain of generalized weakness.   Past Medical History  Diagnosis Date  . Diabetes mellitus without complication    Prior history of DKA  There are no active problems to display for this patient.   Past surgical history Esophageal repair s/p perforation  Current Outpatient Rx  Name  Route  Sig  Dispense  Refill  . insulin aspart protamine- aspart (NOVOLOG MIX 70/30) (70-30) 100 UNIT/ML injection   Subcutaneous   Inject 35 Units into the skin.           Allergies Shellfish allergy  History reviewed. No pertinent family history.  Social History History  Substance Use Topics  . Smoking status: Never Smoker   . Smokeless tobacco: Not on file  . Alcohol Use: No    Review of Systems Constitutional: Positive for generalized weakness. No fever/chills. Eyes: No visual changes. ENT: No sore throat. Cardiovascular: Denies chest pain. Respiratory: Denies shortness of breath. Gastrointestinal: Positive for abdominal pain.  Positive for nausea and vomiting.  No diarrhea.  No  constipation. Genitourinary: Negative for dysuria. Musculoskeletal: Positive for generalized muscle cramps. Negative for back pain. Skin: Negative for rash. Neurological: Negative for headaches, focal weakness or numbness.  10-point ROS otherwise negative.  ____________________________________________   PHYSICAL EXAM:  VITAL SIGNS: ED Triage Vitals  Enc Vitals Group     BP 05/03/15 2254 119/80 mmHg     Pulse Rate 05/03/15 2254 136     Resp 05/03/15 2254 30     Temp 05/03/15 2254 97.9 F (36.6 C)     Temp src --      SpO2 05/03/15 2254 100 %     Weight --      Height --      Head Cir --      Peak Flow --      Pain Score 05/03/15 2258 8     Pain Loc --      Pain Edu? --      Excl. in GC? --     Constitutional: Alert and oriented. Ill-appearing and in moderate acute distress. Eyes: Conjunctivae are normal. PERRL. EOMI. Head: Atraumatic. Nose: No congestion/rhinnorhea. Mouth/Throat: Mucous membranes are dry.  Oropharynx non-erythematous. Neck: No stridor.   Cardiovascular: Tachycardic, regular rhythm. Grossly normal heart sounds.  Good peripheral circulation. Respiratory: Increased respiratory effort.  No retractions. Lungs CTAB. Gastrointestinal: Soft, mildly tender to palpation epigastrium without rebound or guarding. No distention. No abdominal bruits. No CVA tenderness. Musculoskeletal: No lower extremity tenderness nor edema.  No joint effusions. Neurologic:  Normal speech and language. No gross focal neurologic deficits are appreciated.  Skin:  Skin is warm, dry and intact. No  rash noted. Psychiatric: Mood and affect are normal. Speech and behavior are normal.  ____________________________________________   LABS (all labs ordered are listed, but only abnormal results are displayed)  Labs Reviewed  GLUCOSE, CAPILLARY - Abnormal; Notable for the following:    Glucose-Capillary 547 (*)    All other components within normal limits  URINALYSIS COMPLETEWITH  MICROSCOPIC ([facility] ONLY) - Abnormal; Notable for the following:    Color, Urine YELLOW (*)    APPearance CLEAR (*)    Glucose, UA >500 (*)    Ketones, ur 2+ (*)    All other components within normal limits  GLUCOSE, CAPILLARY - Abnormal; Notable for the following:    Glucose-Capillary 538 (*)    All other components within normal limits  BASIC METABOLIC PANEL  CBC  CK  BETA-HYDROXYBUTYRIC ACID  CBG MONITORING, ED   ____________________________________________  EKG  EKG was interpreted by me. Normal sinus rhythm, normal axis, nonspecific ST-T changes. ____________________________________________  RADIOLOGY  None ____________________________________________   PROCEDURES  Procedure(s) performed: None  Critical Care performed: Yes, see critical care note(s)   CRITICAL CARE Performed by: Irean Hong   Total critical care time: 30 minutes  Critical care time was exclusive of separately billable procedures and treating other patients.  Critical care was necessary to treat or prevent imminent or life-threatening deterioration.  Critical care was time spent personally by me on the following activities: development of treatment plan with patient and/or surrogate as well as nursing, discussions with consultants, evaluation of patient's response to treatment, examination of patient, obtaining history from patient or surrogate, ordering and performing treatments and interventions, ordering and review of laboratory studies, ordering and review of radiographic studies, pulse oximetry and re-evaluation of patient's condition.  ____________________________________________   INITIAL IMPRESSION / ASSESSMENT AND PLAN / ED COURSE  Pertinent labs & imaging results that were available during my care of the patient were reviewed by me and considered in my medical decision making (see chart for details).  28y/o male with history of DM1 who presents with weakness, muscle cramps and  vomiting. Patient is ill-appearing, tachycardic, vomiting with elevated blood sugar concerning for DKA. Will initiate IV fluid resuscitation, IV antiemetic; consider insulin drip.  ----------------------------------------- 12:05 AM on 05/04/2015 -----------------------------------------  Labs notable for renal insufficiency, elevated anion gap consistent with DKA. Will initiate insulin drip. Discussed case with hospitalist who will evaluate patient in the ED for admission. ____________________________________________   FINAL CLINICAL IMPRESSION(S) / ED DIAGNOSES  Final diagnoses:  Type 1 diabetes mellitus with ketoacidosis and without coma  Non-intractable vomiting with nausea, vomiting of unspecified type      Irean Hong, MD 05/04/15 [phone]

## 2015-05-03 NOTE — ED Notes (Signed)
Pt to ED from home c/o hyperglycemia and vomiting.  Pt states weakness today, went to work in extreme heat, pt has decreased appetite, vomiting "lost count".  Pt reports DBM type 1.  States ran out of insulin last night.  Pt is A&Ox4, speaking in complete and coherent sentences and in NAD at this time.

## 2015-05-04 ENCOUNTER — Emergency Department: Payer: Self-pay

## 2015-05-04 ENCOUNTER — Encounter: Payer: Self-pay | Admitting: *Deleted

## 2015-05-04 ENCOUNTER — Inpatient Hospital Stay
Admission: EM | Admit: 2015-05-04 | Discharge: 2015-05-05 | DRG: 639 | Disposition: A | Discharge status: Home / Self Care | Payer: BLUE CROSS/BLUE SHIELD | Attending: Internal Medicine | Admitting: Internal Medicine

## 2015-05-04 ENCOUNTER — Encounter: Payer: Self-pay | Admitting: Internal Medicine

## 2015-05-04 DIAGNOSIS — Z794 Long term (current) use of insulin: Secondary | ICD-10-CM | POA: Diagnosis not present

## 2015-05-04 DIAGNOSIS — Z9114 Patient's other noncompliance with medication regimen: Secondary | ICD-10-CM | POA: Diagnosis present

## 2015-05-04 DIAGNOSIS — Z8249 Family history of ischemic heart disease and other diseases of the circulatory system: Secondary | ICD-10-CM

## 2015-05-04 DIAGNOSIS — Z91013 Allergy to seafood: Secondary | ICD-10-CM | POA: Diagnosis not present

## 2015-05-04 DIAGNOSIS — Z833 Family history of diabetes mellitus: Secondary | ICD-10-CM | POA: Diagnosis not present

## 2015-05-04 DIAGNOSIS — E1010 Type 1 diabetes mellitus with ketoacidosis without coma: Principal | ICD-10-CM | POA: Diagnosis present

## 2015-05-04 DIAGNOSIS — E1110 Type 2 diabetes mellitus with ketoacidosis without coma: Secondary | ICD-10-CM | POA: Diagnosis present

## 2015-05-04 DIAGNOSIS — R112 Nausea with vomiting, unspecified: Secondary | ICD-10-CM | POA: Diagnosis present

## 2015-05-04 LAB — BASIC METABOLIC PANEL
ANION GAP: 10 (ref 5–15)
Anion gap: 12 (ref 5–15)
BUN: 32 mg/dL — AB (ref 6–20)
BUN: 24 mg/dL — AB (ref 6–20)
CALCIUM: 8.9 mg/dL (ref 8.9–10.3)
CO2: 20 mmol/L — ABNORMAL LOW (ref 22–32)
CO2: 23 mmol/L (ref 22–32)
CREATININE: 1.21 mg/dL (ref 0.61–1.24)
Calcium: 8.9 mg/dL (ref 8.9–10.3)
Chloride: 106 mmol/L (ref 101–111)
Chloride: 103 mmol/L (ref 101–111)
Creatinine, Ser: 1.23 mg/dL (ref 0.61–1.24)
GFR calc Af Amer: >60 mL/min (ref >60)
GFR calc non Af Amer: >60 mL/min (ref >60)
GLUCOSE: 141 mg/dL — AB (ref 65–99)
Glucose, Bld: 228 mg/dL — ABNORMAL HIGH (ref 65–99)
Potassium: 4.8 mmol/L (ref 3.5–5.1)
Potassium: 4.2 mmol/L (ref 3.5–5.1)
Sodium: 138 mmol/L (ref 135–145)
Sodium: 136 mmol/L (ref 135–145)

## 2015-05-04 LAB — GLUCOSE, CAPILLARY
GLUCOSE-CAPILLARY: 174 mg/dL — AB (ref 65–99)
GLUCOSE-CAPILLARY: 161 mg/dL — AB (ref 65–99)
Glucose-Capillary: 104 mg/dL — ABNORMAL HIGH (ref 65–99)
Glucose-Capillary: 107 mg/dL — ABNORMAL HIGH (ref 65–99)
Glucose-Capillary: 129 mg/dL — ABNORMAL HIGH (ref 65–99)
Glucose-Capillary: 172 mg/dL — ABNORMAL HIGH (ref 65–99)
Glucose-Capillary: 210 mg/dL — ABNORMAL HIGH (ref 65–99)
Glucose-Capillary: 210 mg/dL — ABNORMAL HIGH (ref 65–99)
Glucose-Capillary: 238 mg/dL — ABNORMAL HIGH (ref 65–99)
Glucose-Capillary: 295 mg/dL — ABNORMAL HIGH (ref 65–99)
Glucose-Capillary: 316 mg/dL — ABNORMAL HIGH (ref 65–99)
Glucose-Capillary: 430 mg/dL — ABNORMAL HIGH (ref 65–99)
Glucose-Capillary: 448 mg/dL — ABNORMAL HIGH (ref 65–99)
Glucose-Capillary: 96 mg/dL (ref 65–99)

## 2015-05-04 LAB — CK: Total CK: 122 U/L (ref 49–397)

## 2015-05-04 LAB — BETA-HYDROXYBUTYRIC ACID: Beta-Hydroxybutyric Acid: 6.3 mmol/L — ABNORMAL HIGH (ref 0.05–0.27)

## 2015-05-04 MED ORDER — PANTOPRAZOLE SODIUM 40 MG IV SOLR
40.0000 mg | Freq: Two times a day (BID) | INTRAVENOUS | Status: DC
  Administered 2015-05-04 (×3): 40 mg via INTRAVENOUS
  Filled 2015-05-04 (×3): qty 40

## 2015-05-04 MED ORDER — INSULIN ASPART 100 UNIT/ML ~~LOC~~ SOLN
0.0000 [IU] | Freq: Three times a day (TID) | SUBCUTANEOUS | Status: DC
  Administered 2015-05-05: 08:00:00 11 [IU] via SUBCUTANEOUS
  Filled 2015-05-04: qty 11

## 2015-05-04 MED ORDER — SODIUM CHLORIDE 0.9 % IV SOLN
INTRAVENOUS | Status: DC
Start: 2015-05-04 — End: 2015-05-04
  Administered 2015-05-04: 3.4 [IU]/h via INTRAVENOUS

## 2015-05-04 MED ORDER — SODIUM CHLORIDE 0.9 % IV SOLN
INTRAVENOUS | Status: DC
  Administered 2015-05-04: 03:00:00 via INTRAVENOUS

## 2015-05-04 MED ORDER — HEPARIN SODIUM (PORCINE) 5000 UNIT/ML IJ SOLN: 5000.0000 [IU] | Freq: Three times a day (TID) | INTRAMUSCULAR | Status: DC

## 2015-05-04 MED ORDER — SODIUM CHLORIDE 0.9 % IV SOLN
INTRAVENOUS | Status: DC
  Filled 2015-05-04: qty 2.5

## 2015-05-04 MED ORDER — DEXTROSE-NACL 5-0.45 % IV SOLN
INTRAVENOUS | Status: DC
Start: 2015-05-04 — End: 2015-05-04
  Administered 2015-05-04: 200 mL via INTRAVENOUS

## 2015-05-04 MED ORDER — SODIUM CHLORIDE 0.9 % IV SOLN: INTRAVENOUS | Status: DC

## 2015-05-04 MED ORDER — HEPARIN SODIUM (PORCINE) 5000 UNIT/ML IJ SOLN
5000.0000 [IU] | Freq: Three times a day (TID) | INTRAMUSCULAR | Status: DC
  Administered 2015-05-04 – 2015-05-05 (×4): 5000 [IU] via SUBCUTANEOUS
  Filled 2015-05-04 (×4): qty 1

## 2015-05-04 MED ORDER — INSULIN ASPART PROT & ASPART (70-30 MIX) 100 UNIT/ML ~~LOC~~ SUSP
20.0000 [IU] | Freq: Two times a day (BID) | SUBCUTANEOUS | Status: DC
  Administered 2015-05-04: 20 [IU] via SUBCUTANEOUS
  Filled 2015-05-04: qty 0.2
  Filled 2015-05-04: qty 20

## 2015-05-04 MED ORDER — INSULIN ASPART 100 UNIT/ML ~~LOC~~ SOLN
0.0000 [IU] | Freq: Every day | SUBCUTANEOUS | Status: DC
  Administered 2015-05-04: 23:00:00 5 [IU] via SUBCUTANEOUS
  Filled 2015-05-04: qty 5

## 2015-05-04 MED ORDER — DEXTROSE-NACL 5-0.45 % IV SOLN: INTRAVENOUS | Status: DC

## 2015-05-04 MED ORDER — SODIUM CHLORIDE 0.9 % IV SOLN: INTRAVENOUS | Status: AC

## 2015-05-04 MED ORDER — ONDANSETRON HCL 4 MG/2ML IJ SOLN
4.0000 mg | INTRAMUSCULAR | Status: DC | PRN
  Administered 2015-05-04: 4 mg via INTRAVENOUS
  Filled 2015-05-04: qty 2

## 2015-05-04 MED ORDER — INSULIN GLARGINE 100 UNIT/ML ~~LOC~~ SOLN
10.0000 [IU] | Freq: Once | SUBCUTANEOUS | Status: AC
  Administered 2015-05-04: 10 [IU] via SUBCUTANEOUS
  Filled 2015-05-04: qty 0.1

## 2015-05-04 MED ORDER — SODIUM CHLORIDE 0.9 % IV SOLN
INTRAVENOUS | Status: DC
  Administered 2015-05-04 – 2015-05-05 (×2): via INTRAVENOUS

## 2015-05-04 MED ORDER — PROMETHAZINE HCL 25 MG/ML IJ SOLN
25.0000 mg | Freq: Once | INTRAMUSCULAR | Status: DC
  Filled 2015-05-04: qty 1

## 2015-05-04 MED ORDER — MORPHINE SULFATE 2 MG/ML IJ SOLN: 2.0000 mg | INTRAMUSCULAR | Status: DC | PRN

## 2015-05-04 NOTE — Plan of Care (Signed)
Problem: Discharge Progression Outcomes Goal: CBGs controlled on DM discharge meds Individualization: Pt prefers to be called Jeff Gutierrez who lives at home w/ his wife.  Hx DM controlled by home medications.  Low fall risk. Pt understands how to use call system for any assistance needed.  Goal: Other Discharge Outcomes/Goals Plan of care progress to goals: 1. Transferred from CCU, settled in room, low fall risk.  2. No c/o pain. 3. Hemodynamically: VSS, afebrile, Blood sugar stable 4. Tolerating carb modified diet well.  5. Barrier to d/c is obtaining insulin. Pt states he has insurance but too expensive to afford.

## 2015-05-04 NOTE — H&P (Signed)
[facility] [place] at [facility]   PATIENT NAME: Jeff Gutierrez    MR#:  [id number]  DATE OF BIRTH:  1986-06-28  DATE OF ADMISSION:  05/04/2015  PRIMARY CARE PHYSICIAN: No PCP Per Patient   REQUESTING/REFERRING PHYSICIAN: Dr. Dolores Frame  CHIEF COMPLAINT:  No chief complaint on file.   HISTORY OF PRESENT ILLNESS:  Jeff Gutierrez  is a 29 y.o. male with a known history of diabetes mellitus type 1 presents to the emergency room with the complaints of ongoing nausea vomiting with abdominal pain and elevated blood sugar of one day duration. Patient states he ran out of his insulin last night and went to work today and was exposed to hot weather following which he developed multiple episodes of nausea and vomiting with associated abdominal pain hence came to the emergency room. Evaluation in the ED revealed elevated blood sugars of 534 serum bicarbonate 17, anion gap of 22. Rest of his labs were unremarkable. EKG sinus tachycardia with ventricular rate of 10 4 bpm. Patient was given IV hydration, started on insulin drip and hospitalist service was consulted for further management. Patient denies any history of fever, chills, shortness of breath, chest pain, dysuria.  PAST MEDICAL HISTORY:   Past Medical History  Diagnosis Date  . Diabetes mellitus without complication     PAST SURGICAL HISTORY:   Past Surgical History  Procedure Laterality Date  . Esophagus surgery N/A     SOCIAL HISTORY:   Social History  Substance Use Topics  . Smoking status: Never Smoker   . Smokeless tobacco: Not on file  . Alcohol Use: No    FAMILY HISTORY:   Family History  Problem Relation Age of Onset  . Heart attack Father   . Diabetes Other     DRUG ALLERGIES:   Allergies  Allergen Reactions  . Shellfish Allergy Hives    REVIEW OF SYSTEMS:   Review of Systems  Constitutional: Positive for malaise/fatigue. Negative for fever and chills.  HENT: Negative for ear  pain, hearing loss, nosebleeds, sore throat and tinnitus.   Eyes: Negative for blurred vision, double vision, pain, discharge and redness.  Respiratory: Negative for cough, hemoptysis, sputum production, shortness of breath and wheezing.   Cardiovascular: Negative for chest pain, palpitations, orthopnea and leg swelling.  Gastrointestinal: Positive for nausea, vomiting and abdominal pain. Negative for diarrhea, constipation, blood in stool and melena.  Genitourinary: Negative for dysuria, urgency, frequency and hematuria.  Musculoskeletal: Negative for back pain, joint pain and neck pain.  Skin: Negative for itching and rash.  Neurological: Negative for dizziness, tingling, sensory change, focal weakness and seizures.  Endo/Heme/Allergies: Does not bruise/bleed easily.  Psychiatric/Behavioral: Negative for depression. The patient is not nervous/anxious.     MEDICATIONS AT HOME:   Prior to Admission medications   Medication Sig Start Date End Date Taking? Authorizing Provider  insulin aspart protamine- aspart (NOVOLOG MIX 70/30) (70-30) 100 UNIT/ML injection Inject 35 Units into the skin.    Historical Provider, MD      VITAL SIGNS:  Blood pressure 119/74, pulse 106, resp. rate 12, SpO2 97 %.  PHYSICAL EXAMINATION:  Physical Exam  Constitutional: He is oriented to person, place, and time. He appears well-developed and well-nourished. No distress.  HENT:  Head: Normocephalic and atraumatic.  Right Ear: External ear normal.  Left Ear: External ear normal.  Nose: Nose normal.  Mouth/Throat: No oropharyngeal exudate.  Dry oral mucosa +  Eyes: EOM are normal. Pupils are equal, round, and  reactive to light. No scleral icterus.  Neck: Normal range of motion. Neck supple. No JVD present. No thyromegaly present.  Cardiovascular: Normal rate, regular rhythm, normal heart sounds and intact distal pulses.  Exam reveals no friction rub.   No murmur heard. Respiratory: Effort normal and breath  sounds normal. No respiratory distress. He has no wheezes. He has no rales. He exhibits no tenderness.  GI: Soft. Bowel sounds are normal. He exhibits no distension and no mass. There is no rebound and no guarding.  Mild diffuse abdominal tenderness +  Musculoskeletal: Normal range of motion. He exhibits no edema.  Lymphadenopathy:    He has no cervical adenopathy.  Neurological: He is alert and oriented to person, place, and time. He has normal reflexes. He displays normal reflexes. No cranial nerve deficit. He exhibits normal muscle tone.  Skin: Skin is warm. No rash noted. No erythema.  Psychiatric: He has a normal mood and affect. His behavior is normal. Thought content normal.   LABORATORY PANEL:   CBC  Recent Labs Lab 05/03/15 2303  WBC 12.4*  HGB 16.4  HCT 48.9  PLT 280   ------------------------------------------------------------------------------------------------------------------  Chemistries   Recent Labs Lab 05/03/15 2303  NA 130*  K 5.0  CL 91*  CO2 17*  GLUCOSE 534*  BUN 36*  CREATININE 1.69*  CALCIUM 9.7   ------------------------------------------------------------------------------------------------------------------  Cardiac Enzymes No results for input(s): TROPONINI in the last 168 hours. ------------------------------------------------------------------------------------------------------------------  RADIOLOGY:  Dg Abd Acute W/chest  05/04/2015   CLINICAL DATA:  Hyperglycemia with abdominal pain and vomiting  EXAM: DG ABDOMEN ACUTE W/ 1V CHEST  COMPARISON:  Chest radiograph Feb 15, 2013; abdomen radiographs August 24, 2011  FINDINGS: PA chest: Lungs are clear. Heart size and pulmonary vascularity are normal. No adenopathy.  Supine and upright abdomen: There is fairly diffuse stool throughout the colon. There is no bowel dilatation or air-fluid level suggesting obstruction. No free air. There are small phleboliths in the right pelvis.  IMPRESSION:  Fairly diffuse stool throughout colon. Bowel gas pattern unremarkable. Lungs clear.   Electronically Signed   By: Bretta Bang III M.D.   On: 05/04/2015 03:09    EKG:   Orders placed or performed during the hospital encounter of 05/03/15  . ED EKG  . ED EKG  Sinus tachycardia with ventricular rate of 10 4 bpm, no acute ischemic changes.  IMPRESSION AND PLAN:   1. DKA secondary to ran out of medications. Patient with known history of diabetes mellitus type 1. 2. Nausea vomiting with abdominal pain abdominal pain secondary to above. Plan: Admit to ICU, nothing by mouth, IV pain medications, IV Zofran, IV hydration, continue insulin drip, follow-up BMP. Order 3 way abdominal x-ray.    All the records are reviewed and case discussed with ED provider. Management plans discussed with the patient and in agreement.  CODE STATUS: Full code  TOTAL TIME TAKING CARE OF THIS PATIENT: 50 minutes.    Jonnie Kind N M.D on 05/04/2015 at 5:46 AM  Between 7am to 6pm - Pager - [phone]  After 6pm go to [email or web address] - password [email or web address] [facility]  [place] [facility]  Office  [phone]  CC: Primary care physician; No PCP Per Patient

## 2015-05-04 NOTE — Care Management Note (Signed)
Case Management Note  Patient Details  Name: Jeff Gutierrez MRN: [id number] Date of Birth: 11-23-1985  Subjective/Objective:                  Patient transferred out of ICU to this room today. RNCM consult received for medication assistance. Met with patient and he states he is follow at [facility]. 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 He has transportation to appointments and to pick up Rx. He states his meds are ~$100 and he cannot afford them I advised him to speak with his PCP- he agreed. I also advised him to look online for Rx assistance- he agreed. I have signed him up for [facility] to assist with discharge meds.  Action/Plan: RNCM to follow.  Expected Discharge Date:                  Expected Discharge Plan:     In-House Referral:     Discharge planning Services  [place] [facility], CM Consult  Post Acute Care Choice:    Choice offered to:  Patient  DME Arranged:    DME Agency:     HH Arranged:    [place] Agency:     Status of Service:  Completed, signed off  Medicare Important Message Given:    Date Medicare IM Given:    Medicare IM give by:    Date Additional Medicare IM Given:    Additional Medicare Important Message give by:     If discussed at [place] of Stay Meetings, dates discussed:    Additional Comments:  Marshell Garfinkel, RN 05/04/2015, 3:26 PM

## 2015-05-04 NOTE — Progress Notes (Signed)
Assisted patient in signing up for co-pay card from [employer].  This will allow his insulin to be 25$ per month (instead of 100$).  He is to call [phone] number on print-out to complete sign-up.  Briefly discussed importance of eating with 70/30 mix insulin.  Also discussed hypoglycemia.  He notes that he had hypoglycemic event 2 weeks ago after taking his insulin without eating.  Discussed importance of eating with Novolog 70/30.  He verbalized understanding.    Thanks, Beryl Meager, RN, BC-ADM Inpatient Diabetes Coordinator Pager [phone] (8a-5p)

## 2015-05-04 NOTE — Progress Notes (Signed)
Pt bedtime CBG was 430. Pt came in with DKA and transferred from CCU today. No ssi coverage has been ordered. Notified Dr Clint Guy via telephone to report elevated blood sugar. He said he would put a sliding scale order in.Avion Kutzer Zoe Lan, RN

## 2015-05-04 NOTE — Progress Notes (Signed)
Inpatient Diabetes Program Recommendations  AACE/ADA: New Consensus Statement on Inpatient Glycemic Control (2013)  Target Ranges:  Prepandial:   less than 140 mg/dL      Peak postprandial:   less than 180 mg/dL (1-2 hours)      Critically ill patients:  140 - 180 mg/dL   Reason for Visit: Patient admitted for DKA.  He admits to not taking his insulin for approximately 24 hours due to high co-pay ($100) and cost.  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  He see's Kieth Brightly, NP at [facility] and states that his last A1C was better. Chart review indicates that last A1C was 7.9% on 02/24/15.    Diabetes history: Type 1 diabetes Outpatient Diabetes medications: Novolog 70/30 35 units bid Current orders for Inpatient glycemic control:  IV insulin-Lantus 10 units given and patient to be transitioning off insulin drip due to improved CO2 and closed AG.  Consider restarting a portion of patient's home dose of Novolog 70/30.  Consider Novolog 70/30 25 units bid to start with supper.  Also consider adding Novolog sensitive tid with meals and HS coverage.  Will assist patient with sign-up for co-pay card for Novolog 70/30 which should help with high co-pay of Novolog 70/30 (should reduce to 25$ per month).  Briefly discussed the physiology of DKA and the importance of not skipping insulin.  He states that he did not think that he would get sick so fast.  Discussed insulin's role in glucose metabolism.  Patient seems to understand but does admit that the cost of the insulin is difficult.  Will need f/u with PCP.    Thanks, Beryl Meager, RN, BC-ADM Inpatient Diabetes Coordinator Pager [phone] (8a-5p)

## 2015-05-04 NOTE — Progress Notes (Signed)
[facility] - [place] at [facility]   PATIENT NAME: Jeff Gutierrez    MR#:  [id number]  DATE OF BIRTH:  Mar 12, 1986  SUBJECTIVE:  CHIEF COMPLAINT:  No chief complaint on file.  No complaints. No further nausea.  REVIEW OF SYSTEMS:   Review of Systems  Constitutional: Negative for fever.  Respiratory: Negative for shortness of breath.   Cardiovascular: Negative for chest pain and palpitations.  Gastrointestinal: Negative for nausea, vomiting and abdominal pain.  Genitourinary: Negative for dysuria.    DRUG ALLERGIES:   Allergies  Allergen Reactions  . Shellfish Allergy Hives    VITALS:  Blood pressure 123/72, pulse 93, temperature 97.8 F (36.6 C), temperature source Oral, resp. rate 19, height  (1.88 m), weight 87.227 kg (192 lb 4.8 oz), SpO2 99 %.  PHYSICAL EXAMINATION:  GENERAL:  29 y.o.-year-old patient lying in the bed with no acute distress.  LUNGS: Normal breath sounds bilaterally, no wheezing, rales,rhonchi or crepitation. No use of accessory muscles of respiration.  CARDIOVASCULAR: S1, S2 normal. No murmurs, rubs, or gallops.  ABDOMEN: Soft, tender due to muscle soreness, nondistended. Bowel sounds present. No organomegaly or mass.  EXTREMITIES: No pedal edema, cyanosis, or clubbing.  NEUROLOGIC: Cranial nerves II through XII are intact. Muscle strength 5/5 in all extremities. Sensation intact. Gait not checked.  PSYCHIATRIC: The patient is alert and oriented x 3.  SKIN: No obvious rash, lesion, or ulcer.    LABORATORY PANEL:   CBC  Recent Labs Lab 05/03/15 2303  WBC 12.4*  HGB 16.4  HCT 48.9  PLT 280   ------------------------------------------------------------------------------------------------------------------  Chemistries   Recent Labs Lab 05/04/15 0645  NA 138  K 4.8  CL 106  CO2 20*  GLUCOSE 228*  BUN 32*  CREATININE 1.23  CALCIUM 8.9    ------------------------------------------------------------------------------------------------------------------  Cardiac Enzymes No results for input(s): TROPONINI in the last 168 hours. ------------------------------------------------------------------------------------------------------------------  RADIOLOGY:  Dg Abd Acute W/chest  05/04/2015   CLINICAL DATA:  Hyperglycemia with abdominal pain and vomiting  EXAM: DG ABDOMEN ACUTE W/ 1V CHEST  COMPARISON:  Chest radiograph Feb 15, 2013; abdomen radiographs August 24, 2011  FINDINGS: PA chest: Lungs are clear. Heart size and pulmonary vascularity are normal. No adenopathy.  Supine and upright abdomen: There is fairly diffuse stool throughout the colon. There is no bowel dilatation or air-fluid level suggesting obstruction. No free air. There are small phleboliths in the right pelvis.  IMPRESSION: Fairly diffuse stool throughout colon. Bowel gas pattern unremarkable. Lungs clear.   Electronically Signed   By: Bretta Bang III M.D.   On: 05/04/2015 03:09    EKG:   Orders placed or performed during the hospital encounter of 05/03/15  . ED EKG  . ED EKG    ASSESSMENT AND PLAN:   1) DKA: resolved - will resume 70/30 this evening - appreciate inpatient dm team consultation and assistance with access to meds. - continue to monitor electrolytes BID and cbg ac hs without sliding scale - continue hydration.  All the records are reviewed and case discussed with Care Management/Social Workerr. Management plans discussed with the patient, family and they are in agreement.  CODE STATUS: full  TOTAL TIME TAKING CARE OF THIS PATIENT: 25 minutes.  Greater than 50% of time spent in care coordination and counseling. POSSIBLE D/C IN 1 DAYS, DEPENDING ON CLINICAL CONDITION.   Elby Showers M.D on 05/04/2015 at 2:09 PM  Between 7am to 6pm - Pager - [phone]  After 6pm go  to [email or web address] - password [email or web address] [facility]  [place]  Hospitalists  Office  [phone]  CC: Primary care physician; No PCP Per Patient

## 2015-05-05 LAB — BASIC METABOLIC PANEL
Anion gap: 9 (ref 5–15)
BUN: 18 mg/dL (ref 6–20)
CHLORIDE: 102 mmol/L (ref 101–111)
CO2: 24 mmol/L (ref 22–32)
CREATININE: 1.1 mg/dL (ref 0.61–1.24)
Calcium: 8.5 mg/dL — ABNORMAL LOW (ref 8.9–10.3)
GFR calc non Af Amer: >60 mL/min (ref >60)
GLUCOSE: 325 mg/dL — AB (ref 65–99)
POTASSIUM: 4.9 mmol/L (ref 3.5–5.1)
Sodium: 135 mmol/L (ref 135–145)

## 2015-05-05 LAB — GLUCOSE, CAPILLARY
Glucose-Capillary: 303 mg/dL — ABNORMAL HIGH (ref 65–99)
Glucose-Capillary: 255 mg/dL — ABNORMAL HIGH (ref 65–99)

## 2015-05-05 LAB — CBC
HCT: 38.4 % — ABNORMAL LOW (ref 40.0–52.0)
Hemoglobin: 13.4 g/dL (ref 13.0–18.0)
MCH: 30 pg (ref 26.0–34.0)
MCHC: 34.8 g/dL (ref 32.0–36.0)
MCV: 86.1 fL (ref 80.0–100.0)
PLATELETS: 185 10*3/uL (ref 150–440)
RBC: 4.46 MIL/uL (ref 4.40–5.90)
RDW: 13 % (ref 11.5–14.5)
WBC: 6.8 10*3/uL (ref 3.8–10.6)

## 2015-05-05 MED ORDER — INSULIN ASPART PROT & ASPART (70-30 MIX) 100 UNIT/ML ~~LOC~~ SUSP: 35.0000 [IU] | Freq: Two times a day (BID) | SUBCUTANEOUS | Status: DC

## 2015-05-05 MED ORDER — PANTOPRAZOLE SODIUM 40 MG PO TBEC: 40.0000 mg | DELAYED_RELEASE_TABLET | Freq: Two times a day (BID) | ORAL | Status: DC

## 2015-05-05 MED ORDER — INSULIN ASPART PROT & ASPART (70-30 MIX) 100 UNIT/ML ~~LOC~~ SUSP
35.0000 [IU] | Freq: Two times a day (BID) | SUBCUTANEOUS | Status: DC
  Administered 2015-05-05: 08:00:00 35 [IU] via SUBCUTANEOUS
  Filled 2015-05-05: qty 35

## 2015-05-05 NOTE — Plan of Care (Signed)
Problem: Discharge Progression Outcomes Goal: CBGs controlled on DM discharge meds Individualization:  Outcome: Progressing Pt prefers to be called Jeff Gutierrez who lives at home w/ his wife.   Hx DM controlled by home medications.   Low fall risk. Pt understands how to use call system for any assistance needed Goal: Other Discharge Outcomes/Goals Outcome: Progressing Plan of care progress to goal 1. Pt eating 100% of meals with snacks in between. 2. No c/o pain 3. VSS.  4. CBG was 430 at bedtime. Pt was not on ssi coverage. MD notified. Pt placed on cbg at meals and bedtime.  5. Possible discharge home today. Pt placed on program where he can get insulin for $25 a month instead of $100.

## 2015-05-05 NOTE — Progress Notes (Signed)
CONCERNING: IV to Oral Route Change Policy  RECOMMENDATION: This patient is receiving pantoprazole by the intravenous route.  Based on criteria approved by the Pharmacy and Therapeutics Committee, the intravenous medication(s) is/are being converted to the equivalent oral dose form(s).   DESCRIPTION: These criteria include:  The patient is eating (either orally or via tube) and/or has been taking other orally administered medications for a least 24 hours  The patient has no evidence of active gastrointestinal bleeding or impaired GI absorption (gastrectomy, short bowel, patient on TNA or NPO).  Garlon Hatchet, PharmD Clinical Pharmacist  05/05/2015 8:04 AM

## 2015-05-05 NOTE — Progress Notes (Addendum)
[facility]         [place], [place].   05/05/2015  Patient: Jeff Gutierrez   Date of Birth:  Nov 07, 1985  Date of admission:  05/04/2015  Date of Discharge  05/05/2015    To Whom it May Concern:   [name]  May return to work on 05/09/2015.  WORK-EMPLOYMENT:  Full Duty. Stay hydrated.  If you have any questions or concerns, please don't hesitate to call.  Sincerely,   Elby Showers M.D Pager [phone] Office : [phone]   .

## 2015-05-05 NOTE — Progress Notes (Signed)
Discharge information & new prescription reviewed with patient. Pt shows understanding through teach back method. Pt received certificate to help aid in buying insulin once discharged. MD work note given to pt as well. Pt preference to walk to car & leave w/ father once discharged.

## 2015-05-05 NOTE — Discharge Instructions (Signed)

## 2015-05-05 NOTE — Care Management (Signed)
Copy of insurance card faxed to intake. Gwenette Greet RN MSN Care Management [id number]
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
## 2015-05-10 ENCOUNTER — Emergency Department: Payer: BLUE CROSS/BLUE SHIELD

## 2015-05-10 ENCOUNTER — Emergency Department
Admission: EM | Admit: 2015-05-10 | Discharge: 2015-05-10 | Disposition: A | Discharge status: Home / Self Care | Payer: BLUE CROSS/BLUE SHIELD | Attending: Emergency Medicine | Admitting: Emergency Medicine

## 2015-05-10 ENCOUNTER — Encounter: Payer: Self-pay | Admitting: Emergency Medicine

## 2015-05-10 DIAGNOSIS — Y9389 Activity, other specified: Secondary | ICD-10-CM | POA: Insufficient documentation

## 2015-05-10 DIAGNOSIS — S40812A Abrasion of left upper arm, initial encounter: Secondary | ICD-10-CM | POA: Insufficient documentation

## 2015-05-10 DIAGNOSIS — Z794 Long term (current) use of insulin: Secondary | ICD-10-CM | POA: Diagnosis not present

## 2015-05-10 DIAGNOSIS — S5002XA Contusion of left elbow, initial encounter: Secondary | ICD-10-CM | POA: Insufficient documentation

## 2015-05-10 DIAGNOSIS — E119 Type 2 diabetes mellitus without complications: Secondary | ICD-10-CM | POA: Insufficient documentation

## 2015-05-10 DIAGNOSIS — S0990XA Unspecified injury of head, initial encounter: Secondary | ICD-10-CM

## 2015-05-10 DIAGNOSIS — Y998 Other external cause status: Secondary | ICD-10-CM | POA: Diagnosis not present

## 2015-05-10 DIAGNOSIS — Y92410 Unspecified street and highway as the place of occurrence of the external cause: Secondary | ICD-10-CM | POA: Diagnosis not present

## 2015-05-10 LAB — GLUCOSE, CAPILLARY: Glucose-Capillary: 188 mg/dL — ABNORMAL HIGH (ref 65–99)

## 2015-05-10 MED ORDER — IBUPROFEN 800 MG PO TABS: 800.0000 mg | ORAL_TABLET | Freq: Three times a day (TID) | ORAL | Status: AC | PRN

## 2015-05-10 MED ORDER — TRAMADOL HCL 50 MG PO TABS: 50.0000 mg | ORAL_TABLET | Freq: Three times a day (TID) | ORAL | Status: DC | PRN
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
## 2015-05-16 LAB — GLUCOSE, CAPILLARY: GLUCOSE-CAPILLARY: 374 mg/dL — AB (ref 65–99)

## 2015-07-08 ENCOUNTER — Inpatient Hospital Stay
Admission: EM | Admit: 2015-07-08 | Discharge: 2015-07-09 | DRG: 639 | Disposition: A | Discharge status: Home / Self Care | Payer: BLUE CROSS/BLUE SHIELD | Attending: Internal Medicine | Admitting: Internal Medicine

## 2015-07-08 DIAGNOSIS — D72829 Elevated white blood cell count, unspecified: Secondary | ICD-10-CM | POA: Diagnosis present

## 2015-07-08 DIAGNOSIS — E860 Dehydration: Secondary | ICD-10-CM | POA: Diagnosis present

## 2015-07-08 DIAGNOSIS — E1010 Type 1 diabetes mellitus with ketoacidosis without coma: Principal | ICD-10-CM | POA: Diagnosis present

## 2015-07-08 DIAGNOSIS — Z8249 Family history of ischemic heart disease and other diseases of the circulatory system: Secondary | ICD-10-CM

## 2015-07-08 DIAGNOSIS — E1110 Type 2 diabetes mellitus with ketoacidosis without coma: Secondary | ICD-10-CM | POA: Diagnosis present

## 2015-07-08 DIAGNOSIS — Z833 Family history of diabetes mellitus: Secondary | ICD-10-CM

## 2015-07-08 DIAGNOSIS — Z79899 Other long term (current) drug therapy: Secondary | ICD-10-CM

## 2015-07-08 DIAGNOSIS — Z794 Long term (current) use of insulin: Secondary | ICD-10-CM

## 2015-07-08 LAB — BASIC METABOLIC PANEL
ANION GAP: 12 (ref 5–15)
ANION GAP: 9 (ref 5–15)
BUN: 22 mg/dL — ABNORMAL HIGH (ref 6–20)
BUN: 18 mg/dL (ref 6–20)
CALCIUM: 8.8 mg/dL — AB (ref 8.9–10.3)
CO2: 17 mmol/L — AB (ref 22–32)
CO2: 19 mmol/L — AB (ref 22–32)
Calcium: 8.8 mg/dL — ABNORMAL LOW (ref 8.9–10.3)
Chloride: 108 mmol/L (ref 101–111)
Chloride: 109 mmol/L (ref 101–111)
Creatinine, Ser: 1.14 mg/dL (ref 0.61–1.24)
Creatinine, Ser: 1.01 mg/dL (ref 0.61–1.24)
GFR calc Af Amer: >60 mL/min (ref >60)
GFR calc Af Amer: >60 mL/min (ref >60)
GFR calc non Af Amer: >60 mL/min (ref >60)
GFR calc non Af Amer: >60 mL/min (ref >60)
GLUCOSE: 192 mg/dL — AB (ref 65–99)
GLUCOSE: 146 mg/dL — AB (ref 65–99)
POTASSIUM: 4.9 mmol/L (ref 3.5–5.1)
POTASSIUM: 4.5 mmol/L (ref 3.5–5.1)
Sodium: 137 mmol/L (ref 135–145)
Sodium: 137 mmol/L (ref 135–145)

## 2015-07-08 LAB — BLOOD GAS, VENOUS
Acid-base deficit: 12.2 mmol/L — ABNORMAL HIGH (ref 0.0–2.0)
Bicarbonate: 15.7 mEq/L — ABNORMAL LOW (ref 21.0–28.0)
PCO2 VEN: 42 mmHg — AB (ref 44.0–60.0)
Patient temperature: 37
pH, Ven: 7.18 — CL (ref 7.320–7.430)

## 2015-07-08 LAB — COMPREHENSIVE METABOLIC PANEL
ALBUMIN: 5.2 g/dL — AB (ref 3.5–5.0)
ALT: 60 U/L (ref 17–63)
AST: 34 U/L (ref 15–41)
Alkaline Phosphatase: 125 U/L (ref 38–126)
Anion gap: 19 — ABNORMAL HIGH (ref 5–15)
BUN: 27 mg/dL — AB (ref 6–20)
CHLORIDE: 99 mmol/L — AB (ref 101–111)
CO2: 17 mmol/L — AB (ref 22–32)
Calcium: 9.5 mg/dL (ref 8.9–10.3)
Creatinine, Ser: 1.45 mg/dL — ABNORMAL HIGH (ref 0.61–1.24)
GFR calc Af Amer: >60 mL/min (ref >60)
GFR calc non Af Amer: >60 mL/min (ref >60)
GLUCOSE: 444 mg/dL — AB (ref 65–99)
POTASSIUM: 5 mmol/L (ref 3.5–5.1)
Sodium: 135 mmol/L (ref 135–145)
Total Bilirubin: 1.9 mg/dL — ABNORMAL HIGH (ref 0.3–1.2)
Total Protein: 8.6 g/dL — ABNORMAL HIGH (ref 6.5–8.1)

## 2015-07-08 LAB — CBC WITH DIFFERENTIAL/PLATELET
Basophils Absolute: 0 10*3/uL (ref 0–0.1)
Basophils Relative: 0 %
EOS PCT: 1 %
Eosinophils Absolute: 0.1 10*3/uL (ref 0–0.7)
HCT: 46.5 % (ref 40.0–52.0)
Hemoglobin: 15.4 g/dL (ref 13.0–18.0)
LYMPHS ABS: 0.8 10*3/uL — AB (ref 1.0–3.6)
LYMPHS PCT: 7 %
MCH: 29.5 pg (ref 26.0–34.0)
MCHC: 33.2 g/dL (ref 32.0–36.0)
MCV: 88.9 fL (ref 80.0–100.0)
MONO ABS: 0.3 10*3/uL (ref 0.2–1.0)
MONOS PCT: 3 %
Neutro Abs: 11.1 10*3/uL — ABNORMAL HIGH (ref 1.4–6.5)
Neutrophils Relative %: 89 %
PLATELETS: 237 10*3/uL (ref 150–440)
RBC: 5.23 MIL/uL (ref 4.40–5.90)
RDW: 13.5 % (ref 11.5–14.5)
WBC: 12.4 10*3/uL — ABNORMAL HIGH (ref 3.8–10.6)

## 2015-07-08 LAB — URINALYSIS COMPLETE WITH MICROSCOPIC (ARMC ONLY)
BILIRUBIN URINE: NEGATIVE
Bacteria, UA: NONE SEEN
Glucose, UA: >500 mg/dL — AB
Hgb urine dipstick: NEGATIVE
Leukocytes, UA: NEGATIVE
Nitrite: NEGATIVE
PH: 5 (ref 5.0–8.0)
Protein, ur: NEGATIVE mg/dL
RBC / HPF: NONE SEEN RBC/hpf (ref 0–5)
SQUAMOUS EPITHELIAL / LPF: NONE SEEN
Specific Gravity, Urine: 1.025 (ref 1.005–1.030)
WBC, UA: NONE SEEN WBC/hpf (ref 0–5)

## 2015-07-08 LAB — GLUCOSE, CAPILLARY
GLUCOSE-CAPILLARY: 308 mg/dL — AB (ref 65–99)
GLUCOSE-CAPILLARY: 164 mg/dL — AB (ref 65–99)
GLUCOSE-CAPILLARY: 127 mg/dL — AB (ref 65–99)
GLUCOSE-CAPILLARY: 147 mg/dL — AB (ref 65–99)
GLUCOSE-CAPILLARY: 131 mg/dL — AB (ref 65–99)
GLUCOSE-CAPILLARY: 131 mg/dL — AB (ref 65–99)
Glucose-Capillary: 154 mg/dL — ABNORMAL HIGH (ref 65–99)
Glucose-Capillary: 203 mg/dL — ABNORMAL HIGH (ref 65–99)
Glucose-Capillary: 209 mg/dL — ABNORMAL HIGH (ref 65–99)
Glucose-Capillary: 234 mg/dL — ABNORMAL HIGH (ref 65–99)
Glucose-Capillary: 238 mg/dL — ABNORMAL HIGH (ref 65–99)
Glucose-Capillary: 313 mg/dL — ABNORMAL HIGH (ref 65–99)
Glucose-Capillary: 415 mg/dL — ABNORMAL HIGH (ref 65–99)

## 2015-07-08 LAB — HEMOGLOBIN A1C: Hgb A1c MFr Bld: 9.2 % — ABNORMAL HIGH (ref 4.0–6.0)

## 2015-07-08 LAB — MRSA PCR SCREENING: MRSA by PCR: NEGATIVE

## 2015-07-08 MED ORDER — INSULIN GLARGINE 100 UNIT/ML ~~LOC~~ SOLN
20.0000 [IU] | Freq: Every day | SUBCUTANEOUS | Status: DC
  Administered 2015-07-08: 20 [IU] via SUBCUTANEOUS
  Filled 2015-07-08 (×2): qty 0.2

## 2015-07-08 MED ORDER — ONDANSETRON HCL 4 MG/2ML IJ SOLN
4.0000 mg | Freq: Once | INTRAMUSCULAR | Status: AC
  Administered 2015-07-08: 4 mg via INTRAVENOUS
  Filled 2015-07-08: qty 2

## 2015-07-08 MED ORDER — SODIUM CHLORIDE 0.9 % IV BOLUS (SEPSIS)
1000.0000 mL | Freq: Once | INTRAVENOUS | Status: AC
  Administered 2015-07-08: 1000 mL via INTRAVENOUS

## 2015-07-08 MED ORDER — ENOXAPARIN SODIUM 40 MG/0.4ML ~~LOC~~ SOLN
40.0000 mg | SUBCUTANEOUS | Status: DC
  Administered 2015-07-08 – 2015-07-09 (×2): 40 mg via SUBCUTANEOUS
  Filled 2015-07-08 (×2): qty 0.4

## 2015-07-08 MED ORDER — INSULIN ASPART 100 UNIT/ML ~~LOC~~ SOLN
0.0000 [IU] | Freq: Three times a day (TID) | SUBCUTANEOUS | Status: DC
  Administered 2015-07-09 (×2): 7 [IU] via SUBCUTANEOUS
  Filled 2015-07-08 (×3): qty 7

## 2015-07-08 MED ORDER — PNEUMOCOCCAL VAC POLYVALENT 25 MCG/0.5ML IJ INJ
0.5000 mL | INJECTION | INTRAMUSCULAR | Status: AC
  Administered 2015-07-09: 0.5 mL via INTRAMUSCULAR
  Filled 2015-07-08: qty 0.5

## 2015-07-08 MED ORDER — INFLUENZA VAC SPLIT QUAD 0.5 ML IM SUSY
0.5000 mL | PREFILLED_SYRINGE | INTRAMUSCULAR | Status: AC
  Administered 2015-07-09: 0.5 mL via INTRAMUSCULAR
  Filled 2015-07-08: qty 0.5

## 2015-07-08 MED ORDER — PROMETHAZINE HCL 25 MG/ML IJ SOLN
12.5000 mg | INTRAMUSCULAR | Status: DC | PRN
  Administered 2015-07-08: 25 mg via INTRAVENOUS
  Filled 2015-07-08: qty 1

## 2015-07-08 MED ORDER — POTASSIUM CHLORIDE 10 MEQ/100ML IV SOLN
10.0000 meq | INTRAVENOUS | Status: AC
  Administered 2015-07-08 (×4): 10 meq via INTRAVENOUS
  Filled 2015-07-08 (×4): qty 100

## 2015-07-08 MED ORDER — SODIUM CHLORIDE 0.9 % IV SOLN
INTRAVENOUS | Status: DC
  Administered 2015-07-08: 12:00:00 via INTRAVENOUS
  Administered 2015-07-08: 0.5 [IU]/h via INTRAVENOUS
  Administered 2015-07-08: 0.7 [IU]/h via INTRAVENOUS
  Administered 2015-07-08: 1.7 [IU]/h via INTRAVENOUS
  Administered 2015-07-09: 3.6 [IU]/h via INTRAVENOUS
  Filled 2015-07-08: qty 2.5

## 2015-07-08 MED ORDER — DEXTROSE-NACL 5-0.45 % IV SOLN
INTRAVENOUS | Status: DC
  Administered 2015-07-08: 16:00:00 via INTRAVENOUS

## 2015-07-08 MED ORDER — INSULIN ASPART 100 UNIT/ML ~~LOC~~ SOLN: 0.0000 [IU] | Freq: Every day | SUBCUTANEOUS | Status: DC

## 2015-07-08 MED ORDER — SODIUM CHLORIDE 0.9 % IV SOLN
INTRAVENOUS | Status: DC
  Filled 2015-07-08: qty 2.5

## 2015-07-08 MED ORDER — SODIUM CHLORIDE 0.9 % IV SOLN
INTRAVENOUS | Status: AC
  Administered 2015-07-08: 50 mL/h via INTRAVENOUS

## 2015-07-08 MED ORDER — SODIUM CHLORIDE 0.9 % IV SOLN
INTRAVENOUS | Status: DC
  Administered 2015-07-08: 15:00:00 via INTRAVENOUS

## 2015-07-08 MED ORDER — SODIUM CHLORIDE 0.9 % IV SOLN: INTRAVENOUS | Status: AC

## 2015-07-08 NOTE — Care Management Note (Signed)
Case Management Note  Patient Details  Name: Jeff Gutierrez MRN: [id number] Date of Birth: 1985/11/06  Subjective/Objective:  Self pay patient admitted with DKA. Primary nurse states patient is unable to pay for his medications. Reviewed previous RNCM notes from 04/2015 visit. [facility]. He has just started a new job and doesn't have the documentation to continue with [employer] at this time. RNCM confirmed that patient can get the Novolin R, N and 70/30  for [facility].88 [facility]. Please prescribe this at time of discharge for patient. Patient states he CAN afford to pay that price out of pocket. He has insurance that will be coming into effect soon. No additional needs identified.                   Action/Plan:   Expected Discharge Date:                  Expected Discharge Plan:     In-House Referral:     Discharge planning Services  CM Consult, Medication Assistance  Post Acute Care Choice:    Choice offered to:     DME Arranged:    DME Agency:     HH Arranged:    HH Agency:     Status of Service:  In process, will continue to follow  Medicare Important Message Given:    Date Medicare IM Given:    Medicare IM give by:    Date Additional Medicare IM Given:    Additional Medicare Important Message give by:     If discussed at Long Length of Stay Meetings, dates discussed:    Additional Comments:  Marily MemosLisa M Janai Brannigan, RN 07/08/2015, 2:45 PM

## 2015-07-08 NOTE — ED Provider Notes (Signed)
[facility] Emergency Department Provider Note  Time seen: 10:13 AM  I have reviewed the triage vital signs and the nursing notes.   HISTORY  Chief Complaint Hyperglycemia    HPI Jeff Gutierrez is a 29 y.o. male the past medical history of diabetes who presents the emergency department with nausea, vomiting. According to the patient he has been out of insulin for the past 5 days as he cannot afford it. Patient just started a new job and is waiting for his insurance to kick in. He states last night he became nauseated with vomiting, mild epigastric discomfort which she states is due to the dry heaving. Has noticed increased urination, and fast heart rate. Describes his epigastric discomfort is mild currently. Describes his nausea is severe.    Past Medical History  Diagnosis Date  . Diabetes mellitus without complication [facility])     Patient Active Problem List   Diagnosis Date Noted  . DKA (diabetic ketoacidoses) (HCC) 05/04/2015  . Nausea & vomiting 05/04/2015    Past Surgical History  Procedure Laterality Date  . Esophagus surgery N/A     Current Outpatient Rx  Name  Route  Sig  Dispense  Refill  . ibuprofen (ADVIL,MOTRIN) 800 MG tablet   Oral   Take 1 tablet (800 mg total) by mouth every 8 (eight) hours as needed for mild pain or moderate pain.   15 tablet   0   . insulin aspart protamine- aspart (NOVOLOG MIX 70/30) (70-30) 100 UNIT/ML injection   Subcutaneous   Inject 0.35 mLs (35 Units total) into the skin 2 (two) times daily with a meal.   10 mL   11   . traMADol (ULTRAM) 50 MG tablet   Oral   Take 1 tablet (50 mg total) by mouth every 8 (eight) hours as needed (Do not drive or operate machinery while taking as can cause drowsiness.).   12 tablet   0     Allergies Shellfish allergy  Family History  Problem Relation Age of Onset  . Heart attack Father   . Diabetes Other     Social History Social History  Substance Use  Topics  . Smoking status: Never Smoker   . Smokeless tobacco: None  . Alcohol Use: No    Review of Systems Constitutional: Negative for fever. Cardiovascular: Negative for chest pain. Respiratory: Negative for shortness of breath. Gastrointestinal: Epigastric pain. Musculoskeletal: Negative for back pain. Neurological: Negative for headache 10-point ROS otherwise negative.  ____________________________________________   PHYSICAL EXAM:  VITAL SIGNS: ED Triage Vitals  Enc Vitals Group     BP 07/08/15 0958 126/74 mmHg     Pulse Rate 07/08/15 0958 113     Resp 07/08/15 0958 18     Temp 07/08/15 0958 97.6 F (36.4 C)     Temp Source 07/08/15 0958 Oral     SpO2 07/08/15 0958 98 %     Weight 07/08/15 0958 200 lb (90.719 kg)     Height 07/08/15 0958  (1.88 m)     Head Cir --      Peak Flow --      Pain Score --      Pain Loc --      Pain Edu? --      Excl. in GC? --     Constitutional: Alert and oriented. Moderate distress due to nausea. Eyes: Normal exam ENT   Head: Normocephalic and atraumatic.   Mouth/Throat: Dry mucous membranes Cardiovascular: Normal  rate, regular rhythm. No murmur Respiratory: Normal respiratory effort without tachypnea nor retractions. Breath sounds are clear  Gastrointestinal: Soft, mild/minimal epigastric discomfort/tenderness to palpation. No rebound or guarding. No distention. Musculoskeletal: Nontender with normal range of motion in all extremities.  Neurologic:  Normal speech and language. No gross focal neurologic deficits are appreciated. Speech is normal. Skin:  Skin is warm, dry and intact.  Psychiatric: Mood and affect are normal. Speech and behavior are normal. Patient exhibits appropriate insight and judgment.  ____________________________________________    INITIAL IMPRESSION / ASSESSMENT AND PLAN / ED COURSE  Pertinent labs & imaging results that were available during my care of the patient were reviewed by me and  considered in my medical decision making (see chart for details).  Patient presented to the emergency department with an elevated blood glucose, nausea, vomiting. Patient mild to moderate distress due to nausea/vomiting. Has dry mucous membranes. We will check labs, high suspicion for DKA.  Labs show pH is 7.1, elevated blood glucose with an anion gap of 19. Patient will be admitted to the hospital for DKA. Started on an insulin drip.   CRITICAL CARE Performed by: Minna AntisPADUCHOWSKI, Nara Paternoster   Total critical care time: 45 minutes  Critical care time was exclusive of separately billable procedures and treating other patients.  Critical care was necessary to treat or prevent imminent or life-threatening deterioration.  Critical care was time spent personally by me on the following activities: development of treatment plan with patient and/or surrogate as well as nursing, discussions with consultants, evaluation of patient's response to treatment, examination of patient, obtaining history from patient or surrogate, ordering and performing treatments and interventions, ordering and review of laboratory studies, ordering and review of radiographic studies, pulse oximetry and re-evaluation of patient's condition.   ____________________________________________   FINAL CLINICAL IMPRESSION(S) / ED DIAGNOSES  Nausea and vomiting hyperglycemia Diabetic ketoacidosis  Minna AntisKevin Tashonda Pinkus, MD 07/08/15 1123

## 2015-07-08 NOTE — Progress Notes (Signed)
Patient complains of burning with the infusion of potassium. Rate decreased to 4550ml/hr

## 2015-07-08 NOTE — H&P (Signed)
[facility] [place] [facility]   PATIENT NAME: Jeff RobinsonsChristopher Grays    MR#:  [id number]  DATE OF BIRTH:  [birth year]  DATE OF ADMISSION:  07/08/2015  PR[facility]IC[facility]   REQUESTING/REFERRING PHYSICIAN: Dr Lenard LancePaduchowski  CHIEF COMPLAINT:  Polydipsia with nausea  HISTORY OF PRESENT ILLNESS:  Jeff Gutierrez  is a 29 y.o. male with a known history of type 1 diabetes who presents with above complaint. Patient reports that since Monday he has not had insulin. He did not have money to afford the insulin. He recently started a new job and his insurance has not kicked in. Patient presents with nausea, vomiting and polyuria. Patient was noted to have DKA. Patient has received IV fluids in the emergency room and will be started on insulin drip.  PAST MEDICAL HISTORY:   Past Medical History  Diagnosis Date  . Diabetes mellitus without complication (HCC)     PAST SURGICAL HISTORY:   Past Surgical History  Procedure Laterality Date  . Esophagus surgery N/A     SOCIAL HISTORY:   Social History  Substance Use Topics  . Smoking status: Never Smoker   . Smokeless tobacco: Not on file  . Alcohol Use: No    FAMILY HISTORY:   Family History  Problem Relation Age of Onset  . Heart attack Father   . Diabetes Other     DRUG ALLERGIES:   Allergies  Allergen Reactions  . Shellfish Allergy Hives     REVIEW OF SYSTEMS:  CONSTITUTIONAL: No fever, positive fatigue and weakness.  EYES: No blurred or double vision.  EARS, NOSE, AND THROAT: No tinnitus or ear pain.  RESPIRATORY: No cough, shortness of breath, wheezing or hemoptysis.  CARDIOVASCULAR: No chest pain, orthopnea, edema.  GASTROINTESTINAL: Positive nausea, vomiting, nodiarrhea or abdominal pain.  GENITOURINARY: No dysuria, hematuria.  ENDOCRINE: Positive polyuria, no nocturia,  HEMATOLOGY: No anemia, easy bruising or bleeding SKIN: No rash or lesion. MUSCULOSKELETAL: No joint  pain or arthritis.   NEUROLOGIC: No tingling, numbness, weakness.  PSYCHIATRY: No anxiety or depression.   MEDICATIONS AT HOME:   Prior to Admission medications   Medication Sig Start Date End Date Taking? Authorizing Provider  ibuprofen (ADVIL,MOTRIN) 800 MG tablet Take 1 tablet (800 mg total) by mouth every 8 (eight) hours as needed for mild pain or moderate pain. 05/10/15  Yes Renford DillsLindsey Miller, NP  insulin aspart protamine- aspart (NOVOLOG MIX 70/30) (70-30) 100 UNIT/ML injection Inject 0.35 mLs (35 Units total) into the skin 2 (two) times daily with a meal. Patient taking differently: Inject 35-38 Units into the skin 2 (two) times daily with a meal. 35 units in the morning and 28 units at bedtime 05/05/15  Yes Gale Journeyatherine P Walsh, MD      VITAL SIGNS:  Blood pressure 126/74, pulse 113, temperature 97.6 F (36.4 C), temperature source Oral, resp. rate 18, height 6\' 2"  (1.88 m), weight 90.719 kg (200 lb), SpO2 98 %.  PHYSICAL EXAMINATION:  GENERAL:  29 y.o.-year-old patient lying in the bed with no acute distress.  EYES: Pupils equal, round, reactive to light and accommodation. No scleral icterus. Extraocular muscles intact.  HEENT: Head atraumatic, normocephalic. Oropharynx and nasopharynx clear.  NECK:  Supple, no jugular venous distention. No thyroid enlargement, no tenderness.  LUNGS: Normal breath sounds bilaterally, no wheezing, rales,rhonchi or crepitation. No use of accessory muscles of respiration.  CARDIOVASCULAR: S1, S2 normal. No murmurs, rubs, or gallops.  ABDOMEN: Soft, nontender, nondistended. Bowel  sounds present. No organomegaly or mass.  EXTREMITIES: No pedal edema, cyanosis, or clubbing.  NEUROLOGIC: Cranial nerves II through XII are grossly intact. No focal deficits. PSYCHIATRIC: The patient is alert and oriented x 3.  SKIN: No obvious rash, lesion, or ulcer.   LABORATORY PANEL:   CBC  Recent Labs Lab 07/08/15 1019  WBC 12.4*  HGB 15.4  HCT 46.5  PLT 237    ------------------------------------------------------------------------------------------------------------------  Chemistries   Recent Labs Lab 07/08/15 1019  NA 135  K 5.0  CL 99*  CO2 17*  GLUCOSE 444*  BUN 27*  CREATININE 1.45*  CALCIUM 9.5  AST 34  ALT 60  ALKPHOS 125  BILITOT 1.9*   ------------------------------------------------------------------------------------------------------------------  Cardiac Enzymes No results for input(s): TROPONINI in the last 168 hours. ------------------------------------------------------------------------------------------------------------------  RADIOLOGY:  No results found.  EKG:  Sinus tachycardia no ST elevation or depression  IMPRESSION AND PLAN:  29 year old male with history of type 1 diabetes who ran out of medications on Monday and was unable to afford medications who presents in DKA.  1. DKA: This is secondary to not having his medication since Monday. Patient will be placed in the CCU on DKA protocol. He will need aggressive hydration and insulin drip. BMP will be monitored every 4 hours. Accu-Cheks will be monitored every 1 hour. Once anion gap is closed and CO2 is above 18 patient will be transitioned to a diet and outpatient dose of insulin. I will also check a hemoglobin A1c.  2. Leukocytosis: This is secondary DKA and dehydration. Patient has no signs of infection. Continue to monitor CBC.    All the records are reviewed and case discussed with ED provider. Management plans discussed with the patient and he is in agreement.  CODE STATUS: Full  CRITICAL CARE TOTAL TIME TAKING CARE OF THIS PATIENT: 45 minutes.    Quentavious Rittenhouse M.D on 07/08/2015 at 11:41 AM  Between 7am to 6pm - Pager - [phone] After 6pm go to [email or web address] - password [email or web address] [facility]  [facility]  Office  [phone]  CC: Primary care physician; No PCP Per Patient

## 2015-07-08 NOTE — ED Notes (Signed)
Pt states he is a type 1 diabetic and has been out of insulin for the past 5days due to finances , yesterday began having N/V..Marland Kitchen

## 2015-07-09 LAB — BASIC METABOLIC PANEL
Anion gap: 9 (ref 5–15)
Anion gap: 9 (ref 5–15)
BUN: 18 mg/dL (ref 6–20)
BUN: 17 mg/dL (ref 6–20)
CALCIUM: 8.6 mg/dL — AB (ref 8.9–10.3)
CALCIUM: 8.7 mg/dL — AB (ref 8.9–10.3)
CHLORIDE: 106 mmol/L (ref 101–111)
CO2: 18 mmol/L — AB (ref 22–32)
CO2: 21 mmol/L — AB (ref 22–32)
CREATININE: 1.15 mg/dL (ref 0.61–1.24)
CREATININE: 1.08 mg/dL (ref 0.61–1.24)
Chloride: 106 mmol/L (ref 101–111)
GFR calc Af Amer: >60 mL/min (ref >60)
GFR calc non Af Amer: >60 mL/min (ref >60)
GFR calc non Af Amer: >60 mL/min (ref >60)
GLUCOSE: 323 mg/dL — AB (ref 65–99)
GLUCOSE: 307 mg/dL — AB (ref 65–99)
Potassium: 4.4 mmol/L (ref 3.5–5.1)
Potassium: 4.3 mmol/L (ref 3.5–5.1)
Sodium: 133 mmol/L — ABNORMAL LOW (ref 135–145)
Sodium: 136 mmol/L (ref 135–145)

## 2015-07-09 LAB — GLUCOSE, CAPILLARY
GLUCOSE-CAPILLARY: 223 mg/dL — AB (ref 65–99)
Glucose-Capillary: 311 mg/dL — ABNORMAL HIGH (ref 65–99)

## 2015-07-09 MED ORDER — INSULIN ASPART PROT & ASPART (70-30 MIX) 100 UNIT/ML ~~LOC~~ SUSP
35.0000 [IU] | Freq: Two times a day (BID) | SUBCUTANEOUS | Status: DC
  Administered 2015-07-09: 35 [IU] via SUBCUTANEOUS
  Filled 2015-07-09: qty 35

## 2015-07-09 MED ORDER — INSULIN ASPART PROT & ASPART (70-30 MIX) 100 UNIT/ML ~~LOC~~ SUSP: SUBCUTANEOUS | Status: AC

## 2015-07-09 NOTE — Discharge Instructions (Signed)
If you experience worsening of your admission symptoms, develop shortness of breath, life threatening emergency, suicidal or homicidal thoughts you must seek medical attention immediately by calling 911 or calling your MD immediately if symptoms less severe.   You Must read complete instructions/literature along with all the possible adverse reactions/side effects for all the Medicines you take and that have been prescribed to you. Take any new Medicines after you have completely understood and accept all the possible adverse reactions/side effects.   Please note   You were cared for by a hospitalist during your hospital stay. If you have any questions about your discharge medications or the care you received while you were in the hospital after you are discharged, you can call the unit and asked to speak with the hospitalist on call if the hospitalist that took care of you is not available. Once you are discharged, your primary care physician will handle any further medical issues. Please note that NO REFILLS for any discharge medications will be authorized once you are discharged, as it is imperative that you return to your primary care physician (or establish a relationship with a primary care physician if you do not have one) for your aftercare needs so that they can reassess your need for medications and monitor your lab values.  Please call physician for persistent nausea and vomiting.

## 2015-07-09 NOTE — Progress Notes (Signed)
Patient educated on insulin and understands importance to follow up in one week with his primary care doctor.

## 2015-07-09 NOTE — Progress Notes (Signed)
D/C home with wife.

## 2015-07-09 NOTE — Discharge Summary (Signed)
[facility] [place] [facility]   PATIENT NAME: Jeff RobinsonsChristopher Gutierrez    MR#:  [id number]  DATE OF BIRTH:  [birth year]  DATE OF ADMISSION:  07/08/2015 ADMITTING PHYSICIAN: Adrian SaranSital Ellwyn Ergle, MD  DATE OF DISCHARGE: 07/09/2015  PRIMARY CARE PHYSICIAN: No PCP Per Patient    ADMISSION DIAGNOSIS:  Diabetic ketoacidosis without coma associated with type 1 diabetes mellitus (HCC) [E10.10]  DISCHARGE DIAGNOSIS:  Active Problems:   DKA (diabetic ketoacidoses) (HCC)   SECONDARY DIAGNOSIS:   Past Medical History  Diagnosis Date  . Diabetes mellitus without complication [facility](HCC)     HOSPITAL COURSE:  [id number] year old male with a history of type 1 diabetes who could not afford his insulin and therefore presented in DKA. For further details please further H&P.  1. DKA: Patient was admitted to the ICU. On DKA protocol. His DKA resolved. Patient's hemoglobin A1c was 9.2. I increased his insulin to 38 units in the morning and 42 units at night. He needs close follow up with his primary care physician.    DISCHARGE CONDITIONS AND DIET:  Patient's been discharged home in stable condition on a diabetic diet  CONSULTS OBTAINED:     DRUG ALLERGIES:   Allergies  Allergen Reactions  . Shellfish Allergy Hives    DISCHARGE MEDICATIONS:   Current Discharge Medication List    CONTINUE these medications which have CHANGED   Details  insulin aspart protamine- aspart (NOVOLOG MIX 70/30) (70-30) 100 UNIT/ML injection 38  Units in am and 42 units pm Qty: 10 mL, Refills: 11      CONTINUE these medications which have NOT CHANGED   Details  ibuprofen (ADVIL,MOTRIN) 800 MG tablet Take 1 tablet (800 mg total) by mouth every 8 (eight) hours as needed for mild pain or moderate pain. Qty: 15 tablet, Refills: 0              Today   CHIEF COMPLAINT:  Patient is doing well this morning. Patient is eager to go home. Patient has no symptoms of polyuria or nausea  vomiting.   VITAL SIGNS:  Blood pressure 118/74, pulse 93, temperature 98.7 F (37.1 C), temperature source Oral, resp. rate 17, height 6\' 2"  (1.88 m), weight 90.719 kg (200 lb), SpO2 98 %.   REVIEW OF SYSTEMS:  Review of Systems  Constitutional: Negative for fever, chills and malaise/fatigue.  HENT: Negative for sore throat.   Eyes: Negative for blurred vision.  Respiratory: Negative for cough, hemoptysis, shortness of breath and wheezing.   Cardiovascular: Negative for chest pain, palpitations and leg swelling.  Gastrointestinal: Negative for nausea, vomiting, abdominal pain, diarrhea and blood in stool.  Genitourinary: Negative for dysuria.  Musculoskeletal: Negative for back pain.  Neurological: Negative for dizziness, tremors and headaches.  Endo/Heme/Allergies: Does not bruise/bleed easily.     PHYSICAL EXAMINATION:  GENERAL:  29 y.o.-year-old patient lying in the bed with no acute distress.  NECK:  Supple, no jugular venous distention. No thyroid enlargement, no tenderness.  LUNGS: Normal breath sounds bilaterally, no wheezing, rales,rhonchi  No use of accessory muscles of respiration.  CARDIOVASCULAR: S1, S2 normal. No murmurs, rubs, or gallops.  ABDOMEN: Soft, non-tender, non-distended. Bowel sounds present. No organomegaly or mass.  EXTREMITIES: No pedal edema, cyanosis, or clubbing.  PSYCHIATRIC: The patient is alert and oriented x 3.  SKIN: No obvious rash, lesion, or ulcer.   DATA REVIEW:   CBC  Recent Labs Lab 07/08/15 1019  WBC 12.4*  HGB 15.4  HCT 46.5  PLT 237  Chemistries   Recent Labs Lab 07/08/15 1019  07/09/15 0520  NA 135  < > 136  K 5.0  < > 4.3  CL 99*  < > 106  CO2 17*  < > 21*  GLUCOSE 444*  < > 307*  BUN 27*  < > 17  CREATININE 1.45*  < > 1.08  CALCIUM 9.5  < > 8.7*  AST 34  --   --   ALT 60  --   --   ALKPHOS 125  --   --   BILITOT 1.9*  --   --   < > = values in this interval not displayed.  Cardiac Enzymes No results  for input(s): TROPONINI in the last 168 hours.  Microbiology Results  @  RADIOLOGY:  No results found.    Management plans discussed with the patient and he is in agreement. Stable for discharge home  Patient should follow up with PCP in one week  CODE STATUS:     Code Status Orders        Start     Ordered   07/08/15 1315  Full code   Continuous     07/08/15 1314      TOTAL TIME TAKING CARE OF THIS PATIENT: 35 minutes.    Adilenne Ashworth M.D on 07/09/2015 at 12:44 PM  Between 7am to 6pm - Pager - [phone] After 6pm go to [email or web address] - password [email or web address] [facility]  [facility]  Office  [phone]  CC: Primary care physician; No PCP Per Patient

## 2015-07-09 NOTE — Progress Notes (Signed)
Lab glucose 307.  Insulin drip off since 0100.  Per Dr. Betti Cruzeddy will give SSI coverage at this time.  Instructed patient to call for s/s of hypoglycemia.

## 2015-07-15 LAB — GLUCOSE, CAPILLARY: GLUCOSE-CAPILLARY: 347 mg/dL — AB (ref 65–99)

## 2016-12-22 ENCOUNTER — Emergency Department
Admission: EM | Admit: 2016-12-22 | Discharge: 2016-12-22 | Disposition: A | Discharge status: Home / Self Care | Payer: BLUE CROSS/BLUE SHIELD | Attending: Emergency Medicine | Admitting: Emergency Medicine

## 2016-12-22 ENCOUNTER — Encounter: Payer: Self-pay | Admitting: Emergency Medicine

## 2016-12-22 DIAGNOSIS — R197 Diarrhea, unspecified: Secondary | ICD-10-CM | POA: Insufficient documentation

## 2016-12-22 DIAGNOSIS — Z794 Long term (current) use of insulin: Secondary | ICD-10-CM | POA: Insufficient documentation

## 2016-12-22 DIAGNOSIS — E860 Dehydration: Secondary | ICD-10-CM

## 2016-12-22 DIAGNOSIS — R112 Nausea with vomiting, unspecified: Secondary | ICD-10-CM | POA: Diagnosis present

## 2016-12-22 DIAGNOSIS — E119 Type 2 diabetes mellitus without complications: Secondary | ICD-10-CM | POA: Insufficient documentation

## 2016-12-22 LAB — URINALYSIS, COMPLETE (UACMP) WITH MICROSCOPIC
BILIRUBIN URINE: NEGATIVE
Bacteria, UA: NONE SEEN
Glucose, UA: NEGATIVE mg/dL
HGB URINE DIPSTICK: NEGATIVE
KETONES UR: 5 mg/dL — AB
Leukocytes, UA: NEGATIVE
Nitrite: NEGATIVE
PROTEIN: 100 mg/dL — AB
Specific Gravity, Urine: 1.034 — ABNORMAL HIGH (ref 1.005–1.030)
pH: 5 (ref 5.0–8.0)

## 2016-12-22 LAB — CBC
HEMATOCRIT: 54 % — AB (ref 40.0–52.0)
HEMOGLOBIN: 18.3 g/dL — AB (ref 13.0–18.0)
MCH: 29.4 pg (ref 26.0–34.0)
MCHC: 33.9 g/dL (ref 32.0–36.0)
MCV: 86.5 fL (ref 80.0–100.0)
PLATELETS: 291 10*3/uL (ref 150–440)
RBC: 6.23 MIL/uL — AB (ref 4.40–5.90)
RDW: 13.1 % (ref 11.5–14.5)
WBC: 21.2 10*3/uL — ABNORMAL HIGH (ref 3.8–10.6)

## 2016-12-22 LAB — COMPREHENSIVE METABOLIC PANEL
ALK PHOS: 95 U/L (ref 38–126)
ALT: 64 U/L — ABNORMAL HIGH (ref 17–63)
AST: 37 U/L (ref 15–41)
Albumin: 5 g/dL (ref 3.5–5.0)
Anion gap: 8 (ref 5–15)
BUN: 23 mg/dL — ABNORMAL HIGH (ref 6–20)
CALCIUM: 9.5 mg/dL (ref 8.9–10.3)
CO2: 27 mmol/L (ref 22–32)
Chloride: 105 mmol/L (ref 101–111)
Creatinine, Ser: 1.25 mg/dL — ABNORMAL HIGH (ref 0.61–1.24)
GFR calc non Af Amer: >60 mL/min (ref >60)
Glucose, Bld: 117 mg/dL — ABNORMAL HIGH (ref 65–99)
POTASSIUM: 4.2 mmol/L (ref 3.5–5.1)
SODIUM: 140 mmol/L (ref 135–145)
Total Bilirubin: 0.6 mg/dL (ref 0.3–1.2)
Total Protein: 8.5 g/dL — ABNORMAL HIGH (ref 6.5–8.1)

## 2016-12-22 LAB — LIPASE, BLOOD

## 2016-12-22 LAB — GLUCOSE, CAPILLARY: GLUCOSE-CAPILLARY: 100 mg/dL — AB (ref 65–99)

## 2016-12-22 MED ORDER — ONDANSETRON HCL 4 MG PO TABS: 4.0000 mg | ORAL_TABLET | Freq: Every day | ORAL | 1 refills | Status: AC | PRN

## 2016-12-22 MED ORDER — DICYCLOMINE HCL 20 MG PO TABS: 20.0000 mg | ORAL_TABLET | Freq: Three times a day (TID) | ORAL | 0 refills | Status: AC | PRN

## 2016-12-22 MED ORDER — SODIUM CHLORIDE 0.9 % IV BOLUS (SEPSIS)
2000.0000 mL | Freq: Once | INTRAVENOUS | Status: AC
  Administered 2016-12-22: 2000 mL via INTRAVENOUS

## 2016-12-22 MED ORDER — ONDANSETRON 4 MG PO TBDP
ORAL_TABLET | ORAL | Status: AC
  Administered 2016-12-22: 4 mg via ORAL
  Filled 2016-12-22: qty 1

## 2016-12-22 MED ORDER — ONDANSETRON HCL 4 MG/2ML IJ SOLN
4.0000 mg | Freq: Once | INTRAMUSCULAR | Status: AC
  Administered 2016-12-22: 4 mg via INTRAVENOUS
  Filled 2016-12-22: qty 2

## 2016-12-22 MED ORDER — ONDANSETRON 4 MG PO TBDP
4.0000 mg | ORAL_TABLET | Freq: Once | ORAL | Status: AC | PRN
  Administered 2016-12-22: 4 mg via ORAL

## 2016-12-22 NOTE — ED Provider Notes (Signed)
[facility] Emergency Department Provider Note  ____________________________________________   First MD Initiated Contact with Patient 12/22/16 1551     (approximate)  I have reviewed the triage vital signs and the nursing notes.   HISTORY  Chief Complaint Emesis and Abdominal Pain    HPI Jeff Gutierrez is a 31 y.o. male who self presents to the emergency department with 90 minutes of nausea vomiting and several loose stools. He said he went over to his cousin's house today to get lunch with him but before they could ever go out to eat she suddenly had cramping diffuse abdominal discomfort and he vomited a total of about 10 times and had 2 loose stools. The last thing he ate was about 16 hours prior to this event last night at 10 PM he went to [facility] and ate a lot of fast food. He has a history of type 1 diabetes and is concerned that he may have diabetic ketoacidosis and he came to the emergency department. He reports compliance with his insulin and last took his insulin this morning. He has no history of abdominal surgeries. He does not drink alcohol does not smoke marijuana. He denies fevers or chills. He denies chest pain. He is only minimally nauseated at this point.   Past Medical History:  Diagnosis Date  . Diabetes mellitus without complication [facility])     Patient Active Problem List   Diagnosis Date Noted  . DKA (diabetic ketoacidoses) (HCC) 05/04/2015  . Nausea & vomiting 05/04/2015    Past Surgical History:  Procedure Laterality Date  . ESOPHAGUS SURGERY N/A     Prior to Admission medications   Medication Sig Start Date End Date Taking? Authorizing Provider  dicyclomine (BENTYL) 20 MG tablet Take 1 tablet (20 mg total) by mouth 3 (three) times daily as needed for spasms. 12/22/16 12/22/17  Merrily Brittle, MD  ibuprofen (ADVIL,MOTRIN) 800 MG tablet Take 1 tablet (800 mg total) by mouth every 8 (eight) hours as needed for mild pain or  moderate pain. 05/10/15   Renford Dills, NP  insulin aspart protamine- aspart (NOVOLOG MIX 70/30) (70-30) 100 UNIT/ML injection 38  Units in am and 42 units pm 07/09/15   Adrian Saran, MD  ondansetron (ZOFRAN) 4 MG tablet Take 1 tablet (4 mg total) by mouth daily as needed for nausea or vomiting. 12/22/16 12/22/17  Merrily Brittle, MD    Allergies Shellfish allergy  Family History  Problem Relation Age of Onset  . Heart attack Father   . Diabetes Other     Social History Social History  Substance Use Topics  . Smoking status: Never Smoker  . Smokeless tobacco: Never Used  . Alcohol use No    Review of Systems Constitutional: No fever/chills Eyes: No visual changes. ENT: No sore throat. Cardiovascular: Denies chest pain. Respiratory: Denies shortness of breath. Gastrointestinal: Positive abdominal pain.  Positive nausea, positive vomiting.  Positive diarrhea.  No constipation. Genitourinary: Negative for dysuria. Musculoskeletal: Negative for back pain. Skin: Negative for rash. Neurological: Negative for headaches, focal weakness or numbness.  10-point ROS otherwise negative.  ____________________________________________   PHYSICAL EXAM:  VITAL SIGNS: ED Triage Vitals  Enc Vitals Group     BP 12/22/16 1525 128/82     Pulse Rate 12/22/16 1525 96     Resp 12/22/16 1525 18     Temp 12/22/16 1525 97.8 F (36.6 C)     Temp Source 12/22/16 1525 Oral     SpO2 12/22/16 1525  100 %     Weight 12/22/16 1526 200 lb (90.7 kg)     Height 12/22/16 1526  (1.88 m)     Head Circumference --      Peak Flow --      Pain Score 12/22/16 1525 4     Pain Loc --      Pain Edu? --      Excl. in GC? --     Constitutional: Alert and oriented x 4 well appearing nontoxic no diaphoresis speaks in full, clear sentencesNo ketones on his breath no kussmal breathing Eyes: PERRL EOMI. Head: Atraumatic. Nose: No congestion/rhinnorhea. Mouth/Throat: No trismus Neck: No stridor.     Cardiovascular: Normal rate, regular rhythm. Grossly normal heart sounds.  Good peripheral circulation. Respiratory: Normal respiratory effort.  No retractions. Lungs CTAB and moving good air Gastrointestinal: Soft nondistended nontender no rebound no guarding no peritonitis no McBurney's tenderness negative Rovsing's no costovertebral tenderness negative Murphy's Musculoskeletal: No lower extremity edema   Neurologic:  Normal speech and language. No gross focal neurologic deficits are appreciated. Skin:  Skin is warm, dry and intact. No rash noted. Psychiatric: Mood and affect are normal. Speech and behavior are normal.    ____________________________________________   DIFFERENTIAL  Viral gastroenteritis, bacterial enteritis, diabetic ketoacidosis, appendicitis, biliary colic, cholecystitis, dehydration ____________________________________________   LABS (all labs ordered are listed, but only abnormal results are displayed)  Labs Reviewed  LIPASE, BLOOD - Abnormal; Notable for the following:       Result Value   Lipase <10 (*)    All other components within normal limits  COMPREHENSIVE METABOLIC PANEL - Abnormal; Notable for the following:    Glucose, Bld 117 (*)    BUN 23 (*)    Creatinine, Ser 1.25 (*)    Total Protein 8.5 (*)    ALT 64 (*)    All other components within normal limits  CBC - Abnormal; Notable for the following:    WBC 21.2 (*)    RBC 6.23 (*)    Hemoglobin 18.3 (*)    HCT 54.0 (*)    All other components within normal limits  URINALYSIS, COMPLETE (UACMP) WITH MICROSCOPIC - Abnormal; Notable for the following:    Color, Urine AMBER (*)    APPearance CLOUDY (*)    Specific Gravity, Urine 1.034 (*)    Ketones, ur 5 (*)    Protein, ur 100 (*)    Squamous Epithelial / LPF 0-5 (*)    All other components within normal limits  GLUCOSE, CAPILLARY - Abnormal; Notable for the following:    Glucose-Capillary 100 (*)    All other components within normal  limits  BLOOD GAS, VENOUS - Abnormal; Notable for the following:    Bicarbonate 29.8 (*)    Acid-Base Excess 2.9 (*)    All other components within normal limits    No evidence of pancreatitis mild acute kidney injury __________________________________________  EKG   ____________________________________________  RADIOLOGY   ____________________________________________   PROCEDURES  Procedure(s) performed: no  Procedures  Critical Care performed: no  ____________________________________________   INITIAL IMPRESSION / ASSESSMENT AND PLAN / ED COURSE  Pertinent labs & imaging results that were available during my care of the patient were reviewed by me and considered in my medical decision making (see chart for details).  When I entered the patient's room he was sitting up and appeared very comfortable with normal respirations no ketones on his breath. I doubt diabetic ketoacidosis.  Fluids labs are all  pending. Likely has food poisoning  ----------------------------------------- 5:30 PM on 12/22/2016 -----------------------------------------  The patient's pain is improved and he has not vomited in over an hour. Fortunately he is not in DKA. He would like to go home. I will discharge him with a short course of Zofran and Bentyl. ____________________________________________   FINAL CLINICAL IMPRESSION(S) / ED DIAGNOSES  Final diagnoses:  Nausea vomiting and diarrhea  Dehydration      NEW MEDICATIONS STARTED DURING THIS VISIT:  Discharge Medication List as of 12/22/2016  5:30 PM    START taking these medications   Details  dicyclomine (BENTYL) 20 MG tablet Take 1 tablet (20 mg total) by mouth 3 (three) times daily as needed for spasms., Starting Sat 12/22/2016, Until Sun 12/22/2017, Print    ondansetron (ZOFRAN) 4 MG tablet Take 1 tablet (4 mg total) by mouth daily as needed for nausea or vomiting., Starting Sat 12/22/2016, Until Sun 12/22/2017, Print          Note:  This document was prepared using Dragon voice recognition software and may include unintentional dictation errors.     Merrily Brittle, MD 12/22/16 1840

## 2016-12-22 NOTE — ED Triage Notes (Signed)
Pt presents to ED c/o n/v/d starting today with generalized cramping abd pain. Pt reports x10 episodes of vomiting and x4 episodes diarrhea. Denies recent abt use. Hx DM.

## 2016-12-22 NOTE — Discharge Instructions (Signed)
Please return to the emergency department for any new or worsening symptoms such as if her pain is not better tomorrow, if he develops a fever, if you cannot eat or drink, or for any other concerns.  It was a pleasure to take care of you today, and thank you for coming to our emergency department.  If you have any questions or concerns before leaving please ask the nurse to grab me and I'm more than happy to go through your aftercare instructions again.  If you were prescribed any opioid pain medication today such as Norco, Vicodin, Percocet, morphine, hydrocodone, or oxycodone please make sure you do not drive when you are taking this medication as it can alter your ability to drive safely.  If you have any concerns once you are home that you are not improving or are in fact getting worse before you can make it to your follow-up appointment, please do not hesitate to call [phone] and come back for further evaluation.  Merrily Brittle MD  Results for orders placed or performed during the hospital encounter of 12/22/16  Lipase, blood  Result Value Ref Range   Lipase <10 (L) 11 - 51 U/L  Comprehensive metabolic panel  Result Value Ref Range   Sodium 140 135 - 145 mmol/L   Potassium 4.2 3.5 - 5.1 mmol/L   Chloride 105 101 - 111 mmol/L   CO2 27 22 - 32 mmol/L   Glucose, Bld 117 (H) 65 - 99 mg/dL   BUN 23 (H) 6 - 20 mg/dL   Creatinine, Ser 2.95 (H) 0.61 - 1.24 mg/dL   Calcium 9.5 8.9 - 62.1 mg/dL   Total Protein 8.5 (H) 6.5 - 8.1 g/dL   Albumin 5.0 3.5 - 5.0 g/dL   AST 37 15 - 41 U/L   ALT 64 (H) 17 - 63 U/L   Alkaline Phosphatase 95 38 - 126 U/L   Total Bilirubin 0.6 0.3 - 1.2 mg/dL   GFR calc non Af Amer >60 >60 mL/min   GFR calc Af Amer >60 >60 mL/min   Anion gap 8 5 - 15  CBC  Result Value Ref Range   WBC 21.2 (H) 3.8 - 10.6 K/uL   RBC 6.23 (H) 4.40 - 5.90 MIL/uL   Hemoglobin 18.3 (H) 13.0 - 18.0 g/dL   HCT 30.8 (H) 65.7 - 84.6 %   MCV 86.5 80.0 - 100.0 fL   MCH 29.4 26.0 - 34.0 pg     MCHC 33.9 32.0 - 36.0 g/dL   RDW 96.2 95.2 - 84.1 %   Platelets 291 150 - 440 K/uL  Urinalysis, Complete w Microscopic  Result Value Ref Range   Color, Urine AMBER (A) YELLOW   APPearance CLOUDY (A) CLEAR   Specific Gravity, Urine 1.034 (H) 1.005 - 1.030   pH 5.0 5.0 - 8.0   Glucose, UA NEGATIVE NEGATIVE mg/dL   Hgb urine dipstick NEGATIVE NEGATIVE   Bilirubin Urine NEGATIVE NEGATIVE   Ketones, ur 5 (A) NEGATIVE mg/dL   Protein, ur 324 (A) NEGATIVE mg/dL   Nitrite NEGATIVE NEGATIVE   Leukocytes, UA NEGATIVE NEGATIVE   RBC / HPF 6-30 0 - 5 RBC/hpf   WBC, UA 0-5 0 - 5 WBC/hpf   Bacteria, UA NONE SEEN NONE SEEN   Squamous Epithelial / LPF 0-5 (A) NONE SEEN   Mucous PRESENT    Hyaline Casts, UA PRESENT    Sperm, UA PRESENT   Glucose, capillary  Result Value Ref Range   Glucose-Capillary  100 (H) 65 - 99 mg/dL  Blood gas, venous  Result Value Ref Range   FIO2 PENDING    pH, Ven 7.35 7.250 - 7.430   pCO2, Ven 54 44.0 - 60.0 mmHg   pO2, Ven PENDING 32.0 - 45.0 mmHg   Bicarbonate 29.8 (H) 20.0 - 28.0 mmol/L   Acid-Base Excess 2.9 (H) 0.0 - 2.0 mmol/L   O2 Saturation PENDING %   Patient temperature 37.0    Collection site VEIN    Sample type VEIN    Mechanical Rate PENDING

## 2017-01-01 LAB — BLOOD GAS, VENOUS
Acid-Base Excess: 2.9 mmol/L — ABNORMAL HIGH (ref 0.0–2.0)
Bicarbonate: 29.8 mmol/L — ABNORMAL HIGH (ref 20.0–28.0)
Patient temperature: 37
pCO2, Ven: 54 mmHg (ref 44.0–60.0)
pH, Ven: 7.35 (ref 7.250–7.430)

## 2018-09-28 ENCOUNTER — Other Ambulatory Visit: Payer: Self-pay

## 2018-09-28 ENCOUNTER — Emergency Department
Admission: EM | Admit: 2018-09-28 | Discharge: 2018-09-28 | Disposition: A | Discharge status: Home / Self Care | Payer: BLUE CROSS/BLUE SHIELD | Attending: Emergency Medicine | Admitting: Emergency Medicine

## 2018-09-28 ENCOUNTER — Emergency Department: Payer: BLUE CROSS/BLUE SHIELD

## 2018-09-28 DIAGNOSIS — R197 Diarrhea, unspecified: Secondary | ICD-10-CM | POA: Insufficient documentation

## 2018-09-28 DIAGNOSIS — K2900 Acute gastritis without bleeding: Secondary | ICD-10-CM | POA: Diagnosis not present

## 2018-09-28 DIAGNOSIS — R112 Nausea with vomiting, unspecified: Secondary | ICD-10-CM | POA: Diagnosis present

## 2018-09-28 DIAGNOSIS — Z794 Long term (current) use of insulin: Secondary | ICD-10-CM | POA: Diagnosis not present

## 2018-09-28 DIAGNOSIS — E119 Type 2 diabetes mellitus without complications: Secondary | ICD-10-CM | POA: Insufficient documentation

## 2018-09-28 DIAGNOSIS — R1010 Upper abdominal pain, unspecified: Secondary | ICD-10-CM | POA: Insufficient documentation

## 2018-09-28 LAB — COMPREHENSIVE METABOLIC PANEL
ALBUMIN: 4.6 g/dL (ref 3.5–5.0)
ALK PHOS: 76 U/L (ref 38–126)
ALT: 21 U/L (ref 0–44)
AST: 20 U/L (ref 15–41)
Anion gap: 7 (ref 5–15)
BUN: 12 mg/dL (ref 6–20)
CALCIUM: 9.6 mg/dL (ref 8.9–10.3)
CHLORIDE: 107 mmol/L (ref 98–111)
CO2: 28 mmol/L (ref 22–32)
CREATININE: 0.91 mg/dL (ref 0.61–1.24)
GFR calc Af Amer: >60 mL/min (ref >60)
GFR calc non Af Amer: >60 mL/min (ref >60)
GLUCOSE: 96 mg/dL (ref 70–99)
Potassium: 4.1 mmol/L (ref 3.5–5.1)
SODIUM: 142 mmol/L (ref 135–145)
Total Bilirubin: 0.7 mg/dL (ref 0.3–1.2)
Total Protein: 7.5 g/dL (ref 6.5–8.1)

## 2018-09-28 LAB — CBC
HEMATOCRIT: 49.4 % (ref 39.0–52.0)
Hemoglobin: 16.8 g/dL (ref 13.0–17.0)
MCH: 29.7 pg (ref 26.0–34.0)
MCHC: 34 g/dL (ref 30.0–36.0)
MCV: 87.3 fL (ref 80.0–100.0)
PLATELETS: 280 10*3/uL (ref 150–400)
RBC: 5.66 MIL/uL (ref 4.22–5.81)
RDW: 12.3 % (ref 11.5–15.5)
WBC: 6.3 10*3/uL (ref 4.0–10.5)
nRBC: 0 % (ref 0.0–0.2)

## 2018-09-28 LAB — URINALYSIS, COMPLETE (UACMP) WITH MICROSCOPIC
BACTERIA UA: NONE SEEN
BILIRUBIN URINE: NEGATIVE
GLUCOSE, UA: NEGATIVE mg/dL
HGB URINE DIPSTICK: NEGATIVE
Ketones, ur: NEGATIVE mg/dL
Leukocytes, UA: NEGATIVE
Nitrite: NEGATIVE
PROTEIN: NEGATIVE mg/dL
Specific Gravity, Urine: 1.021 (ref 1.005–1.030)
pH: 6 (ref 5.0–8.0)

## 2018-09-28 LAB — LIPASE, BLOOD: Lipase: 18 U/L (ref 11–51)

## 2018-09-28 LAB — GLUCOSE, CAPILLARY: Glucose-Capillary: 68 mg/dL — ABNORMAL LOW (ref 70–99)

## 2018-09-28 MED ORDER — ONDANSETRON 4 MG PO TBDP: 4.0000 mg | ORAL_TABLET | Freq: Four times a day (QID) | ORAL | 0 refills | Status: AC | PRN

## 2018-09-28 MED ORDER — ONDANSETRON 4 MG PO TBDP
4.0000 mg | ORAL_TABLET | Freq: Once | ORAL | Status: AC | PRN
  Administered 2018-09-28: 4 mg via ORAL
  Filled 2018-09-28: qty 1

## 2018-09-28 MED ORDER — IOPAMIDOL (ISOVUE-300) INJECTION 61%
100.0000 mL | Freq: Once | INTRAVENOUS | Status: AC | PRN
  Administered 2018-09-28: 100 mL via INTRAVENOUS
  Filled 2018-09-28: qty 100

## 2018-09-28 MED ORDER — ONDANSETRON HCL 4 MG/2ML IJ SOLN
4.0000 mg | Freq: Once | INTRAMUSCULAR | Status: AC
  Administered 2018-09-28: 4 mg via INTRAVENOUS
  Filled 2018-09-28: qty 2

## 2018-09-28 MED ORDER — IOPAMIDOL (ISOVUE-300) INJECTION 61%
30.0000 mL | Freq: Once | INTRAVENOUS | Status: AC
  Administered 2018-09-28: 30 mL via ORAL
  Filled 2018-09-28: qty 30

## 2018-09-28 MED ORDER — SODIUM CHLORIDE 0.9 % IV BOLUS
1000.0000 mL | Freq: Once | INTRAVENOUS | Status: AC
  Administered 2018-09-28: 1000 mL via INTRAVENOUS

## 2018-09-28 NOTE — Discharge Instructions (Signed)

## 2018-09-28 NOTE — ED Triage Notes (Signed)
Pt states "I think I've been fighting a stomach bug" since 12/31. States hasn't eaten since then. Vomiting but no diarrhea. Still has abd organs.   A&O, ambulatory.

## 2018-09-28 NOTE — ED Provider Notes (Signed)
[facility] Emergency Department Provider Note   ____________________________________________   First MD Initiated Contact with Patient 09/28/18 1418     (approximate)  I have reviewed the triage vital signs and the nursing notes.   HISTORY  Chief Complaint Emesis    HPI Jeff Gutierrez is a 33 y.o. male is type I diabetic  Patient reports that after eating in a restaurant about a week ago he started having some stomach discomfort, nausea, and vomited several times with loose stools and diarrhea.  None since then he has had very poor appetite, often when he tries to eat things he will vomit them out.  Continue to have dry heaving off and on for several days.  Pain is upper abdomen, not sure if it is due to all of vomitings experience  Denies history of gastroparesis.  No travel.  No raw food.  Not having diarrhea anymore.  Reports he continues to have discomfort and pain in the upper abdomen especially after eating.  Took Pepto-Bismol for a couple of days, and reports that he noticed his stools were quite dark afterwards.  No chest pain or trouble breathing.  No rash.  No headache.  Continues to utilize his insulin and a sliding scale.    Patient also reports he has a previous history of an esophageal tear in the past due to an episode where he had severe vomiting.  Had it surgically repaired, denies chest pain or shortness of breath.  Has been vomiting quite a bit and reports whenever he drinks or eats it seems to come back up  Past Medical History:  Diagnosis Date  . Diabetes mellitus without complication [facility](HCC)     Patient Active Problem List   Diagnosis Date Noted  . DKA (diabetic ketoacidoses) (HCC) 05/04/2015  . Nausea & vomiting 05/04/2015    Past Surgical History:  Procedure Laterality Date  . ESOPHAGUS SURGERY N/A     Prior to Admission medications   Medication Sig Start Date End Date Taking? Authorizing Provider  dicyclomine  (BENTYL) 20 MG tablet Take 1 tablet (20 mg total) by mouth 3 (three) times daily as needed for spasms. 12/22/16 12/22/17  Merrily Brittleifenbark, Neil, MD  ibuprofen (ADVIL,MOTRIN) 800 MG tablet Take 1 tablet (800 mg total) by mouth every 8 (eight) hours as needed for mild pain or moderate pain. 05/10/15   Renford DillsMiller, Lindsey, NP  insulin aspart protamine- aspart (NOVOLOG MIX 70/30) (70-30) 100 UNIT/ML injection 38  Units in am and 42 units pm 07/09/15   Adrian SaranMody, Sital, MD  ondansetron (ZOFRAN ODT) 4 MG disintegrating tablet Take 1 tablet (4 mg total) by mouth every 6 (six) hours as needed for nausea or vomiting. 09/28/18   Sharyn CreamerQuale, Winifred Balogh, MD    Allergies Shellfish allergy  Family History  Problem Relation Age of Onset  . Heart attack Father   . Diabetes Other     Social History Social History   Tobacco Use  . Smoking status: Never Smoker  . Smokeless tobacco: Never Used  Substance Use Topics  . Alcohol use: No  . Drug use: No    Review of Systems Constitutional: No fever/chills Eyes: No visual changes. ENT: No sore throat. Cardiovascular: Denies chest pain. Respiratory: Denies shortness of breath. Gastrointestinal: See HPI genitourinary: Negative for dysuria. Musculoskeletal: Negative for back pain. Skin: Negative for rash. Neurological: Negative for headaches, areas of focal weakness or numbness.    ____________________________________________   PHYSICAL EXAM:  VITAL SIGNS: ED Triage Vitals  Enc  Vitals Group     BP 09/28/18 1334 127/85     Pulse Rate 09/28/18 1334 94     Resp 09/28/18 1334 18     Temp 09/28/18 1334 98.2 F (36.8 C)     Temp Source 09/28/18 1334 Oral     SpO2 09/28/18 1334 100 %     Weight 09/28/18 1335 225 lb (102.1 kg)     Height 09/28/18 1335 6\' 2"  (1.88 m)     Head Circumference --      Peak Flow --      Pain Score 09/28/18 1341 7     Pain Loc --      Pain Edu? --      Excl. in [place]? --     Constitutional: Alert and oriented. Well appearing and in no acute  distress. Eyes: Conjunctivae are normal. Head: Atraumatic. Nose: No congestion/rhinnorhea. Mouth/Throat: Mucous membranes are moist. Neck: No stridor.  Cardiovascular: Normal rate, regular rhythm. Grossly normal heart sounds.  Good peripheral circulation. Respiratory: Normal respiratory effort.  No retractions. Lungs CTAB. Gastrointestinal: Soft and moderately tender in the epigastrium, minimal discomfort in the lower quadrants and flanks/upper quadrants except does report increased pain epigastrium and also over the left upper quadrant.  No distention.  No peritonitis.  No rebound or guarding. Musculoskeletal: No lower extremity tenderness nor edema. Neurologic:  Normal speech and language. No gross focal neurologic deficits are appreciated.  Skin:  Skin is warm, dry and intact. No rash noted. Psychiatric: Mood and affect are normal. Speech and behavior are normal.  ____________________________________________   LABS (all labs ordered are listed, but only abnormal results are displayed)  Labs Reviewed  URINALYSIS, COMPLETE (UACMP) WITH MICROSCOPIC - Abnormal; Notable for the following components:      Result Value   Color, Urine YELLOW (*)    APPearance CLEAR (*)    All other components within normal limits  GLUCOSE, CAPILLARY - Abnormal; Notable for the following components:   Glucose-Capillary 68 (*)    All other components within normal limits  GASTROINTESTINAL PANEL BY PCR, STOOL (REPLACES STOOL CULTURE)  C DIFFICILE QUICK SCREEN W PCR REFLEX  LIPASE, BLOOD  COMPREHENSIVE METABOLIC PANEL  CBC  CBG MONITORING, ED   ____________________________________________  EKG   ____________________________________________  RADIOLOGY  CT chest abdomen pelvis reviewed, negative for acute findings.  There is no evidence of acute esophageal perforation, no evidence of acute intra-abdominal etiology ____________________________________________   PROCEDURES  Procedure(s)  performed: None  Procedures  Critical Care performed: No  ____________________________________________   INITIAL IMPRESSION / ASSESSMENT AND PLAN / ED COURSE  Pertinent labs & imaging results that were available during my care of the patient were reviewed by me and considered in my medical decision making (see chart for details).   Patient here for evaluation for ongoing nausea and vomiting.  Initially what he thought was possible viral illness or food poisoning with nausea vomiting diarrhea about a week ago, but then has had persistent upper abdominal discomfort especially after eating.  Frequent vomiting.    Differential diagnosis includes but is not limited to, abdominal perforation, aortic dissection, cholecystitis, appendicitis, diverticulitis, colitis, esophagitis/gastritis, kidney stone, pyelonephritis, urinary tract infection, aortic aneurysm. All are considered in decision and treatment plan. Based upon the patient's presentation and risk factors, will proceed with CT scan as patient is diabetic, reports multiple rounds of nausea vomiting for several days and does have a previous history of esophageal perforation though he is not complaining of chest pain today  -----------------------------------------  4:34 PM on 09/28/2018 -----------------------------------------  Patient reports feel better.  Able to drink a bottle contrast without any vomiting.  Nausea is much improved with Zofran.  Discussed with patient, he is very familiar with antacids and will start over-the-counter Pepcid, also go back on Protonix or Nexium, follow-up closely with his GI physician at [facility].  I suspect this is likely some sort of residual gastritis related to a self-limited gastrointestinal illness, but also would consider peptic ulcer disease and advised him to stay away from NSAIDs which he does not currently take  Return precautions and treatment recommendations and follow-up discussed with the patient who  is agreeable with the plan.    ____________________________________________   FINAL CLINICAL IMPRESSION(S) / ED DIAGNOSES  Final diagnoses:  Nausea vomiting and diarrhea  Other acute gastritis without hemorrhage        Note:  This document was prepared using Dragon voice recognition software and may include unintentional dictation errors       Sharyn Creamer, MD 09/28/18 1635

## 2018-09-28 NOTE — ED Notes (Signed)
Cup of orange juice provided to pt. Will recheck CBG.

## 2019-01-16 IMAGING — CT CT CHEST W/ CM
2 of 4 series · 14 of 36 positions shown, 17 images · IV contrast (iopamidol)
Comparison: None.

CLINICAL DATA: Epigastric pain with nausea. Unable to tolerate
entire portion of p.o. contrast.

EXAM:
CT CHEST, ABDOMEN, AND PELVIS WITH CONTRAST
TECHNIQUE: Multidetector CT imaging of the chest, abdomen and pelvis was
performed following the standard protocol during bolus
administration of intravenous contrast.
CONTRAST:  100mL 2FOIDC-ZQQ IOPAMIDOL (2FOIDC-ZQQ) INJECTION 61%

[Series 2: cap with · axial · 0.74mm/px · z∈[+534,+1104]mm · 11 of 136 slices shown, 14 images]
[im 11/136  mediastinal]
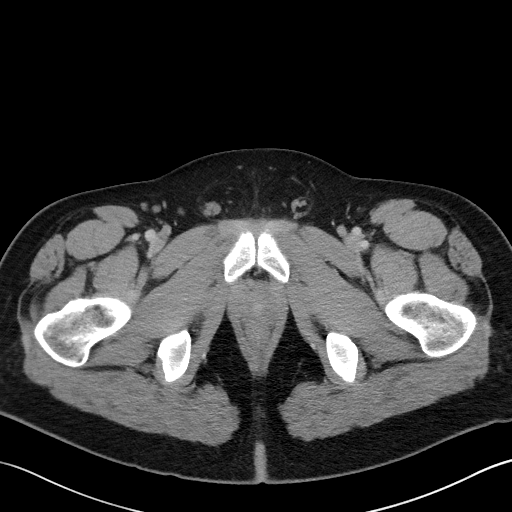
[im 11/136  lung]
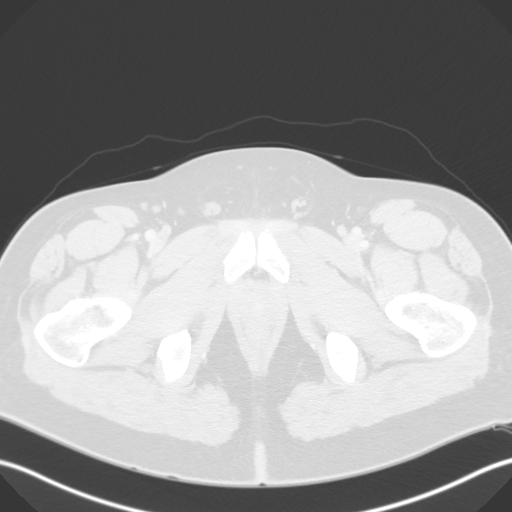
[im 21/136  lung]
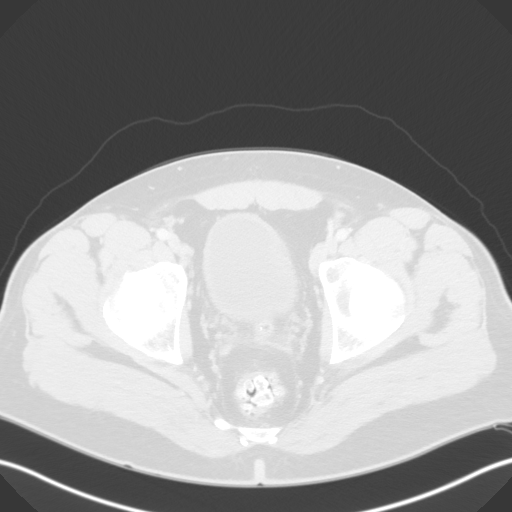
[im 32/136  lung]
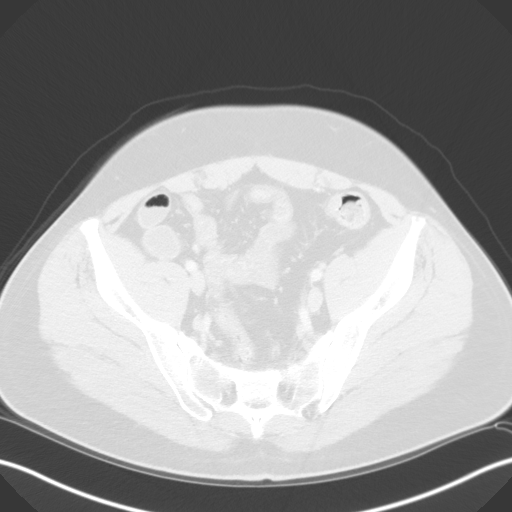
[im 42/136  lung]
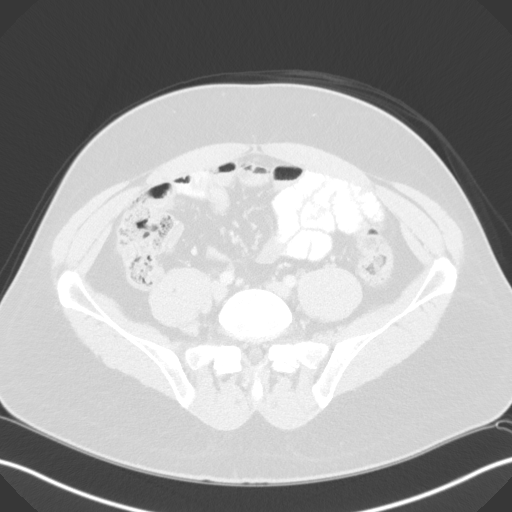
[im 52/136  mediastinal]
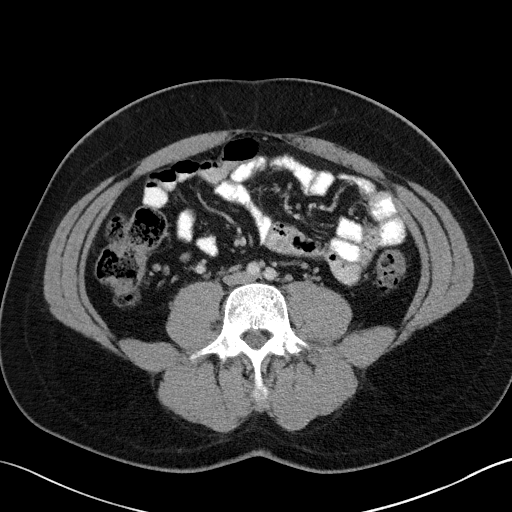
[im 52/136  lung]
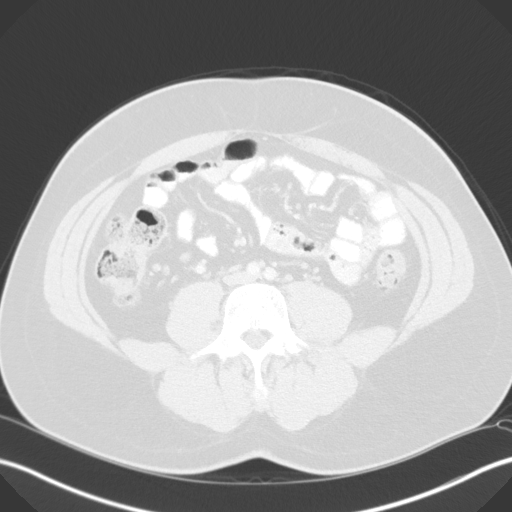
[im 73/136  lung]
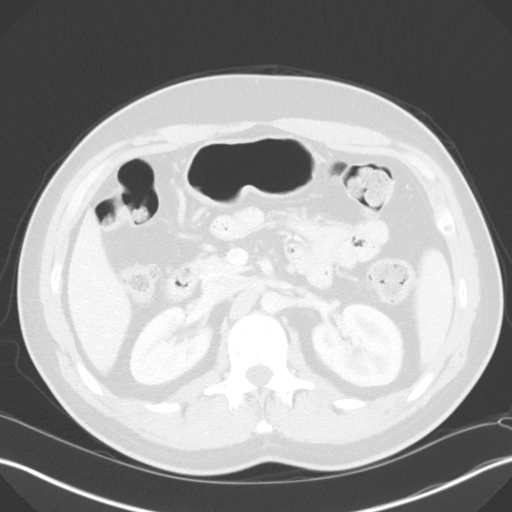
[im 84/136  lung]
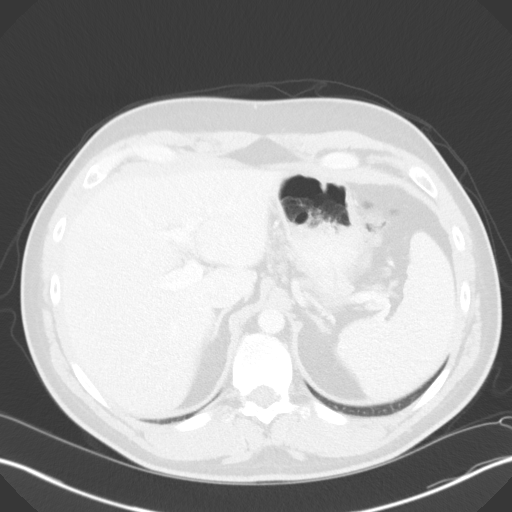
[im 94/136  lung]
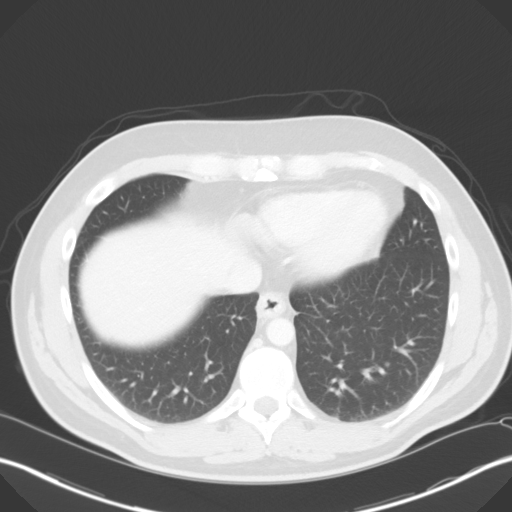
[im 104/136  mediastinal]
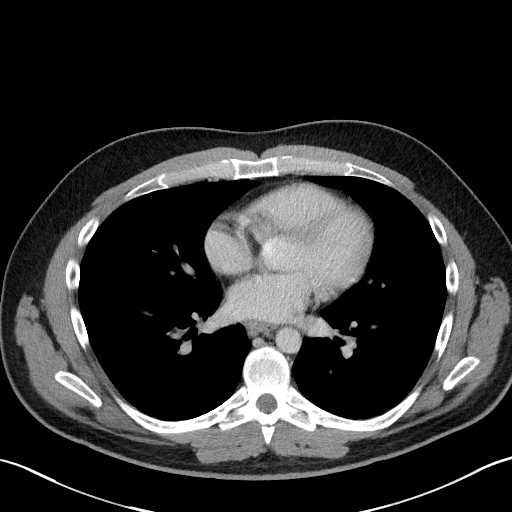
[im 104/136  lung]
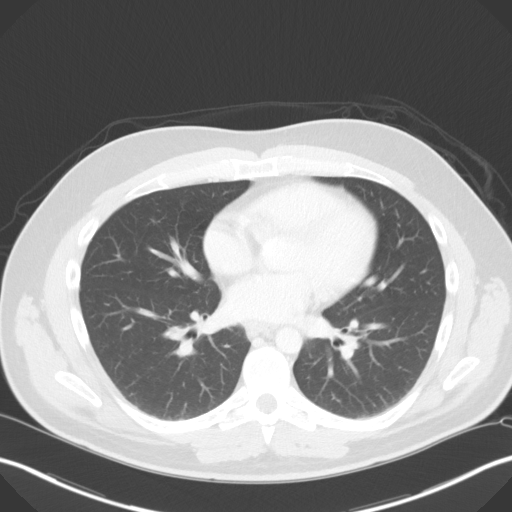
[im 115/136  lung]
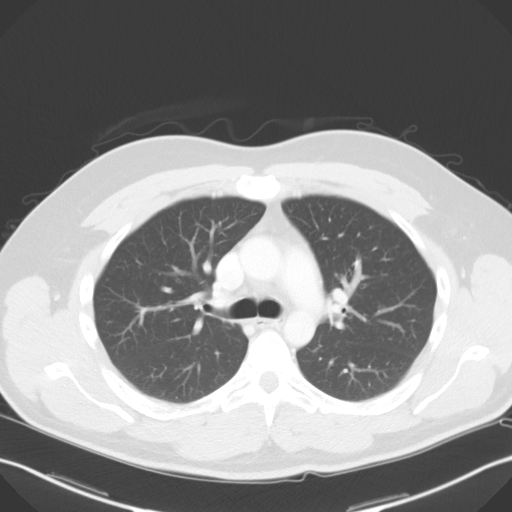
[im 125/136  lung]
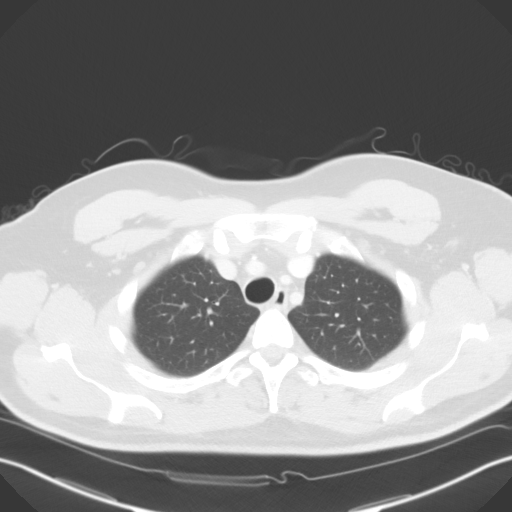

[Series 5: coronals · coronal · 0.76mm/px · 3 of 156 slices shown]
[im 32/156  lung]
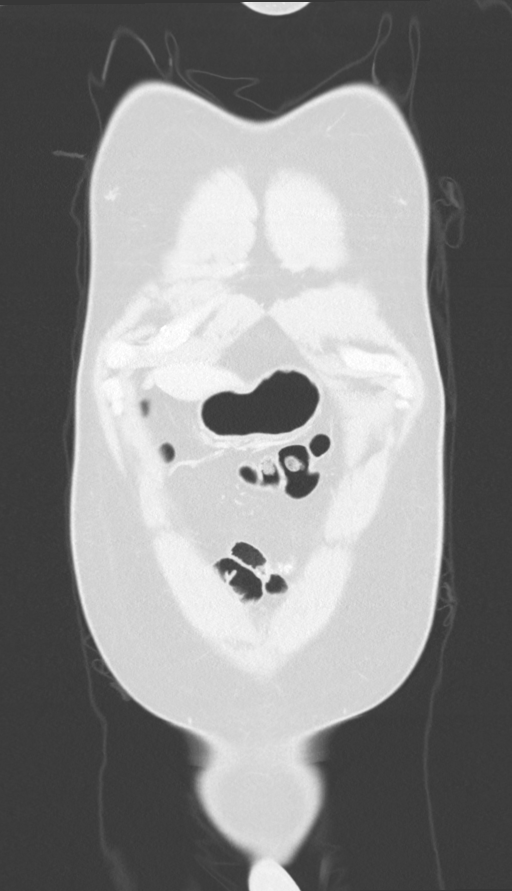
[im 63/156  lung]
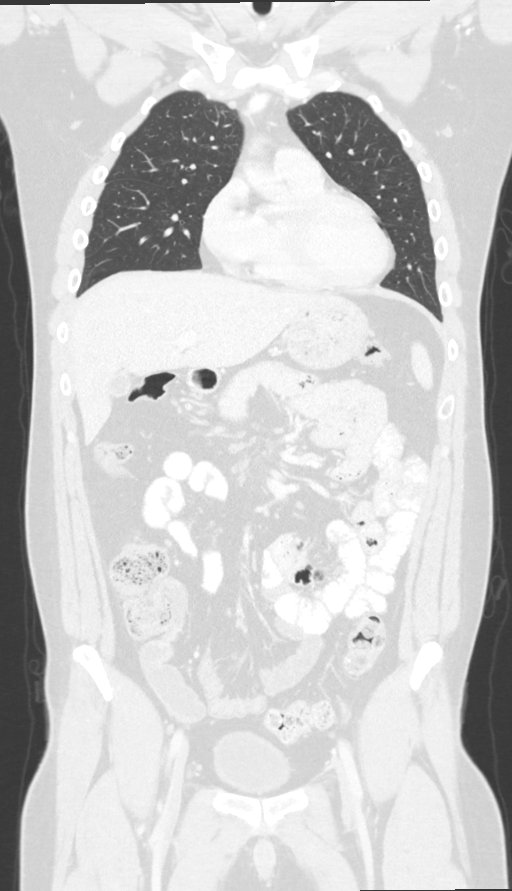
[im 94/156  lung]
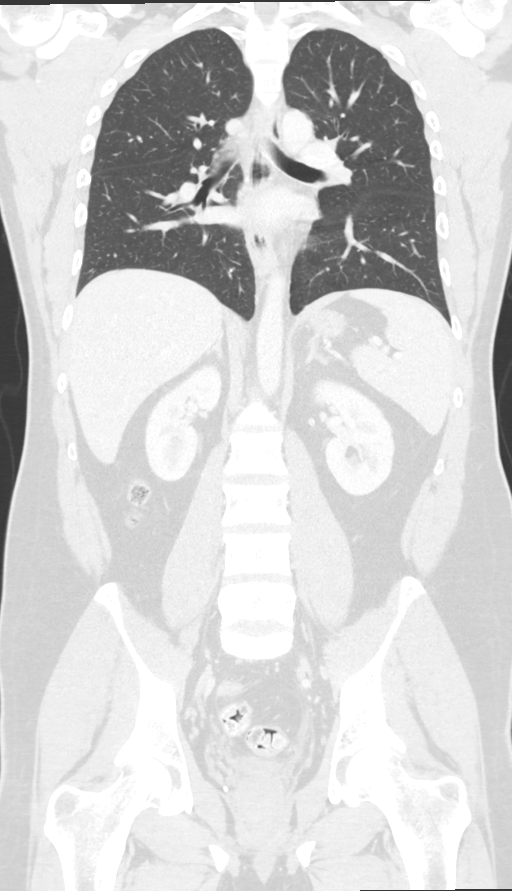

[14 of 36 positions shown; findings below may reference images not displayed]

FINDINGS: CT CHEST FINDINGS

Cardiovascular: Heart is normal size. Thoracic aorta is normal.
Pulmonary arterial system is normal.

Mediastinum/Nodes: No mediastinal or hilar adenopathy. Remaining
mediastinal structures are normal.

Lungs/Pleura: Lungs are adequately inflated. 3 mm nodule over the
posterior left lower lobe. Subtle 1 cm hazy subpleural density over
the posteromedial right base likely atelectasis. Airways are normal.

Musculoskeletal: Normal.

CT ABDOMEN PELVIS FINDINGS

Hepatobiliary: Gallbladder somewhat contracted. Liver and biliary
tree are normal.

Pancreas: Normal.

Spleen: Normal.

Adrenals/Urinary Tract: Adrenal glands are normal. Kidneys are
normal in size without hydronephrosis or nephrolithiasis. Ureters
and bladder are normal.

Stomach/Bowel: Stomach and small bowel are normal. Appendix is
normal. Colon is normal.

Vascular/Lymphatic: Normal vascular structures. Few small periaortic
lymph nodes likely reactive.

Reproductive: Normal.

Other: No free fluid or focal inflammatory change.

Musculoskeletal: Normal.
IMPRESSION: No acute findings in the chest, abdomen or pelvis.

## 2022-10-24 ENCOUNTER — Emergency Department
Admission: EM | Admit: 2022-10-24 | Discharge: 2022-10-24 | Disposition: A | Discharge status: Home / Self Care | Payer: 59 | Attending: Emergency Medicine | Admitting: Emergency Medicine

## 2022-10-24 ENCOUNTER — Other Ambulatory Visit: Payer: Self-pay

## 2022-10-24 DIAGNOSIS — W260XXA Contact with knife, initial encounter: Secondary | ICD-10-CM | POA: Diagnosis not present

## 2022-10-24 DIAGNOSIS — E1110 Type 2 diabetes mellitus with ketoacidosis without coma: Secondary | ICD-10-CM | POA: Insufficient documentation

## 2022-10-24 DIAGNOSIS — S61012A Laceration without foreign body of left thumb without damage to nail, initial encounter: Secondary | ICD-10-CM | POA: Insufficient documentation

## 2022-10-24 DIAGNOSIS — S6992XA Unspecified injury of left wrist, hand and finger(s), initial encounter: Secondary | ICD-10-CM | POA: Diagnosis present

## 2022-10-24 DIAGNOSIS — Y92009 Unspecified place in unspecified non-institutional (private) residence as the place of occurrence of the external cause: Secondary | ICD-10-CM | POA: Diagnosis not present

## 2022-10-24 DIAGNOSIS — Y9389 Activity, other specified: Secondary | ICD-10-CM | POA: Insufficient documentation

## 2022-10-24 NOTE — ED Triage Notes (Signed)
Pt come with c/o left thumb laceration. Pt stats he cut it with pocket knife while working today at home. Pt has bandage in place and bleeding controlled.

## 2022-10-24 NOTE — Discharge Instructions (Addendum)
You were evaluated in the emergency department for a laceration. It was repaired with sutures. Keep the area clean and dry.  Wash multiple times per day with soap and water.  Do not go into the ocean or swimming pool.  Return to the emergency department or your primary care physician's office in 7 days for suture removal.  Return to the emergency department for:  -- Fever > 100.4F -- Increase pain in the wound -- Increase redness and swelling -- Pus coming from the wound -- Wound bleeds more than a small amount or it does not stop -- Wound edges come apart -- Severe pain -- Weakness or numbness in the affected area  Or any other new or worsening symptoms. It was a pleasure caring for you.  

## 2022-10-24 NOTE — ED Provider Notes (Signed)
[facility] Provider Note    Event Date/Time   First MD Initiated Contact with Patient 10/24/22 1819     (approximate)   History   Laceration   HPI  Jeff Gutierrez is a 37 y.o. male who presents today for evaluation of laceration to his left thumb.  Patient reports that he reached into his pocket and did not know that his pocket knife was open.  He reports that he sliced the dorsum of his left thumb.  This occurred just prior to arrival.  He reports that his tetanus is up-to-date.  He denies paresthesias.  He reports that he still able to use his thumb normally.  Patient Active Problem List   Diagnosis Date Noted   DKA (diabetic ketoacidoses) ([place]) 05/04/2015   Nausea & vomiting 05/04/2015          Physical Exam   Triage Vital Signs: ED Triage Vitals  Enc Vitals Group     BP 10/24/22 1813 (!) 130/92     Pulse Rate 10/24/22 1813 91     Resp 10/24/22 1813 18     Temp 10/24/22 1813 97.8 F (36.6 C)     Temp src --      SpO2 10/24/22 1813 99 %     Weight --      Height --      Head Circumference --      Peak Flow --      Pain Score 10/24/22 1812 2     Pain Loc --      Pain Edu? --      Excl. in [place]? --     Most recent vital signs: Vitals:   10/24/22 1813  BP: (!) 130/92  Pulse: 91  Resp: 18  Temp: 97.8 F (36.6 C)  SpO2: 99%    Physical Exam Vitals and nursing note reviewed.  Constitutional:      General: Awake and alert. No acute distress.    Appearance: Normal appearance. The patient is normal weight.  HENT:     Head: Normocephalic and atraumatic.     Mouth: Mucous membranes are moist.  Eyes:     General: PERRL. Normal EOMs        Right eye: No discharge.        Left eye: No discharge.     Conjunctiva/sclera: Conjunctivae normal.  Cardiovascular:     Rate and Rhythm: Normal rate and regular rhythm.     Pulses: Normal pulses.  Pulmonary:     Effort: Pulmonary effort is normal. No respiratory distress.     Breath  sounds: Normal breath sounds.  Abdominal:     Abdomen is soft. There is no abdominal tenderness. No rebound or guarding. No distention. Musculoskeletal:        General: No swelling. Normal range of motion.     Cervical back: Normal range of motion and neck supple.  Left hand: 2.5 cm linear laceration to the dorsum of the thumb.  Patient able to flex and extend and isolated MCP and PIP against resistance.  Sensation intact light touch throughout.  Bleeding is controlled.  No visible tendon.  No bony tenderness.  No injury to the nail.  Able to visualize the base of the laceration, no retained foreign body Skin:    General: Skin is warm and dry.     Capillary Refill: Capillary refill takes less than 2 seconds.     Findings: No rash.  Neurological:     Mental  Status: The patient is awake and alert.      ED Results / Procedures / Treatments   Labs (all labs ordered are listed, but only abnormal results are displayed) Labs Reviewed - No data to display   EKG     RADIOLOGY     PROCEDURES:  Critical Care performed:   Marland KitchenMarland KitchenLaceration Repair  Date/Time: 10/24/2022 6:49 PM  Performed by: Marquette Old, PA-C Authorized by: Marquette Old, PA-C   Consent:    Consent obtained:  Verbal   Consent given by:  Patient   Risks, benefits, and alternatives were discussed: yes     Risks discussed:  Infection, need for additional repair, nerve damage, poor wound healing, poor cosmetic result, pain, retained foreign body, tendon damage and vascular damage   Alternatives discussed:  No treatment Universal protocol:    Procedure explained and questions answered to patient or proxy's satisfaction: yes     Relevant documents present and verified: yes     Required blood products, implants, devices, and special equipment available: yes     Site/side marked: yes     Immediately prior to procedure, a time out was called: yes     Patient identity confirmed:  Verbally with patient Anesthesia:     Anesthesia method:  Local infiltration   Local anesthetic:  Lidocaine 1% w/o epi Laceration details:    Location:  Finger   Finger location:  L thumb   Length (cm):  2.5   Depth (mm):  2 Pre-procedure details:    Preparation:  Patient was prepped and draped in usual sterile fashion Exploration:    Limited defect created (wound extended): no     Hemostasis achieved with:  Direct pressure   Imaging outcome: foreign body not noted     Wound exploration: wound explored through full range of motion and entire depth of wound visualized     Contaminated: no   Treatment:    Amount of cleaning:  Extensive   Irrigation solution:  Tap water and sterile saline   Irrigation method:  Tap   Debridement:  None   Undermining:  None   Scar revision: no   Skin repair:    Repair method:  Sutures   Suture size:  5-0   Suture material:  Nylon   Suture technique:  Simple interrupted   Number of sutures:  3 Approximation:    Approximation:  Close Repair type:    Repair type:  Simple Post-procedure details:    Dressing:  Open (no dressing)    MEDICATIONS ORDERED IN ED: Medications - No data to display   IMPRESSION / MDM / [place] / ED COURSE  I reviewed the triage vital signs and the nursing notes.   Differential diagnosis includes, but is not limited to, laceration, less likely tendon/ligament injury, bony injury, or retained foreign body.  Patient is neurovascularly intact.  Sensation intact to light touch throughout finger, do not suspect nerve injury.  Able to flex and extend at isolated DIP and PIP and MCP, do not suspect tendon injury.  No fingernail or nailbed involvement. Wound fully probed and evaluated, no retained foreign body.  Wound was anesthetized and irrigated, closed with sutures.  We discussed suture care and timeline for removal.  Tdap already up-to-date. Patient understands and agrees with plan.  Patient's presentation is most consistent with acute illness /  injury with system symptoms.      FINAL CLINICAL IMPRESSION(S) / ED DIAGNOSES   Final diagnoses:  Laceration  of left thumb without damage to nail, foreign body presence unspecified, initial encounter     Rx / DC Orders   ED Discharge Orders     None        Note:  This document was prepared using Dragon voice recognition software and may include unintentional dictation errors.   Emeline Gins 10/24/22 1855    Vanessa [place], MD 10/25/22 [phone]
